# Patient Record
Sex: Female | Born: 1955 | Marital: Married | Smoking: Never smoker
Health system: Southern US, Community
[De-identification: ages and names within clinical notes are randomized; demographics above are authoritative.]

## PROBLEM LIST (undated history)

## (undated) DIAGNOSIS — F419 Anxiety disorder, unspecified: Secondary | ICD-10-CM

## (undated) DIAGNOSIS — M1990 Unspecified osteoarthritis, unspecified site: Secondary | ICD-10-CM

## (undated) DIAGNOSIS — E785 Hyperlipidemia, unspecified: Secondary | ICD-10-CM

## (undated) DIAGNOSIS — R002 Palpitations: Secondary | ICD-10-CM

## (undated) DIAGNOSIS — K219 Gastro-esophageal reflux disease without esophagitis: Secondary | ICD-10-CM

## (undated) HISTORY — DX: Hyperlipidemia, unspecified: E78.5

## (undated) HISTORY — DX: Unspecified osteoarthritis, unspecified site: M19.90

## (undated) HISTORY — DX: Anxiety disorder, unspecified: F41.9

## (undated) HISTORY — DX: Gastro-esophageal reflux disease without esophagitis: K21.9

## (undated) HISTORY — DX: Palpitations: R00.2

---
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
## 1986-02-27 HISTORY — PX: OTHER SURGICAL HISTORY: SHX169

## 1996-02-28 HISTORY — PX: OTHER SURGICAL HISTORY: SHX169

## 1996-02-28 HISTORY — PX: VAGINAL HYSTERECTOMY: SUR661

## 2005-12-05 ENCOUNTER — Ambulatory Visit: Payer: Self-pay | Admitting: Family Medicine

## 2013-05-07 LAB — BASIC METABOLIC PANEL
BUN: 14 mg/dL (ref 4–21)
Creatinine: 0.8 mg/dL (ref 0.5–1.1)
GLUCOSE: 76 mg/dL
Potassium: 4.1 mmol/L (ref 3.4–5.3)
Sodium: 142 mmol/L (ref 137–147)

## 2013-05-07 LAB — H. PYLORI ANTIBODY, IGG: H. pylori Index Value: <0.9

## 2013-05-07 LAB — HEPATIC FUNCTION PANEL
ALT: 23 U/L (ref 7–35)
AST: 24 U/L (ref 13–35)
Alkaline Phosphatase: 76 U/L (ref 25–125)

## 2013-05-13 ENCOUNTER — Telehealth: Payer: Self-pay | Admitting: *Deleted

## 2013-05-13 ENCOUNTER — Ambulatory Visit (INDEPENDENT_AMBULATORY_CARE_PROVIDER_SITE_OTHER): Payer: No Typology Code available for payment source | Admitting: Family Medicine

## 2013-05-13 ENCOUNTER — Encounter: Payer: Self-pay | Admitting: Family Medicine

## 2013-05-13 VITALS — BP 122/75 | HR 79 | Temp 98.3°F | Ht 65.0 in | Wt 179.0 lb

## 2013-05-13 DIAGNOSIS — Z803 Family history of malignant neoplasm of breast: Secondary | ICD-10-CM | POA: Insufficient documentation

## 2013-05-13 DIAGNOSIS — Z1211 Encounter for screening for malignant neoplasm of colon: Secondary | ICD-10-CM

## 2013-05-13 DIAGNOSIS — Z8249 Family history of ischemic heart disease and other diseases of the circulatory system: Secondary | ICD-10-CM | POA: Insufficient documentation

## 2013-05-13 DIAGNOSIS — Z0000 Encounter for general adult medical examination without abnormal findings: Secondary | ICD-10-CM

## 2013-05-13 NOTE — Telephone Encounter (Signed)
Sent fax to remit recent mammogram.Kealan Buchan, Viann Shoveonya Lynetta

## 2013-05-13 NOTE — Progress Notes (Signed)
Subjective:    Patient ID: Julia Perez, female    DOB: August 18, 1955, 58 y.o.   MRN: [id number]  HPI Here for reestablishing care and for physical today. She reports that she does get her mammogram done yearly. Last one was in the fall at [facility] [place]. She does have a family history of breast cancer. She does not have any chronic medical conditions or take any chronic medications except for a vitamin and calcium. Her tetanus was last done in 2010. Her flu shot is up to date. She is interested in discussing the shingles vaccine. Her blood work has been several years ago.   Review of Systems  Constitutional: Negative for fever, diaphoresis and unexpected weight change.  HENT: Negative for hearing loss, rhinorrhea and tinnitus.   Eyes: Negative for visual disturbance.  Respiratory: Negative for cough and wheezing.   Cardiovascular: Positive for palpitations. Negative for chest pain.  Gastrointestinal: Negative for nausea, vomiting, diarrhea and blood in stool.  Genitourinary: Negative for vaginal bleeding, vaginal discharge and difficulty urinating.  Musculoskeletal: Positive for arthralgias. Negative for myalgias.  Skin: Negative for rash.  Neurological: Negative for headaches.  Hematological: Negative for adenopathy. Does not bruise/bleed easily.  Psychiatric/Behavioral: Negative for sleep disturbance and dysphoric mood. The patient is not nervous/anxious.    BP 122/75  Pulse 79  Temp(Src) 98.3 F (36.8 C)  Ht 5\' 5"  (1.651 m)  Wt 179 lb (81.194 kg)  BMI 29.79 kg/m2    Allergies not on file  No past medical history on file.  Past Surgical History  Procedure Laterality Date  . Vaginal hysterectomy  1998  . Endoscopic pregnancy  1998  . Bladder tac  1988  . Hernia repair  1959    History   Social History  . Marital Status: Married    Spouse Name: [name]    Number of Children: 6  . Years of Education: N/A   Occupational History  . hairstylist     Social History Main Topics  . Smoking status: Never Smoker   . Smokeless tobacco: Not on file  . Alcohol Use: No  . Drug Use: No  . Sexual Activity: Yes    Partners: Male   Other Topics Concern  . 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 She is married to [name] with 6 children. Her youngest daughter and third some of the home. 7 pregnancies and 6 deliveries with one miscarriage.    Family History  Problem Relation Age of Onset  . Breast cancer Mother 57    Postmenopausal  . Heart attack Father 85  . Diabetes Mellitus II Mother     No outpatient encounter prescriptions on file as of 05/13/2013.          Objective:   Physical Exam  Constitutional: She is oriented to person, place, and time. She appears well-developed and well-nourished.  HENT:  Head: Normocephalic and atraumatic.  Cardiovascular: Normal rate, regular rhythm and normal heart sounds.   Pulmonary/Chest: Effort normal and breath sounds normal.  Neurological: She is alert and oriented to person, place, and time.  Skin: Skin is warm and dry.  Psychiatric: She has a normal mood and affect. Her behavior is normal.          Assessment & Plan:  CPE -  Keep up a regular exercise program and make sure you are eating a healthy diet Try to eat 4 servings of  dairy a day, or if you are lactose intolerant take a calcium with vitamin D daily.  Your vaccines are up to date.  Shingles vaccine. Encouraged her to call her insurance to check on coverage before we do it.  Other vaccines are up to date. Next  Due for screening colonoscopy. We'll place a referral. Did discuss options with her.  Mammogram is up-to-date. We'll call to get report.  Due for screening CMP lipid panel. Also add a CBC since she does have a history of anemia.  She's also been having left knee pain. I gave her the information to contact or sports medicine doctor for  further evaluation and treatment. She says something going on since she was little and slipped on ice. She's never been evaluated for it.

## 2013-05-13 NOTE — Patient Instructions (Signed)
Keep up a regular exercise program and make sure you are eating a healthy diet Try to eat 4 servings of dairy a day, or if you are lactose intolerant take a calcium with vitamin D daily.  Your vaccines are up to date.   

## 2013-05-19 LAB — COMPLETE METABOLIC PANEL WITH GFR
ALT: 23 U/L (ref 0–35)
AST: 21 U/L (ref 0–37)
Albumin: 4.2 g/dL (ref 3.5–5.2)
Alkaline Phosphatase: 70 U/L (ref 39–117)
BUN: 11 mg/dL (ref 6–23)
CALCIUM: 9.2 mg/dL (ref 8.4–10.5)
CHLORIDE: 104 meq/L (ref 96–112)
CO2: 29 meq/L (ref 19–32)
Creat: 0.75 mg/dL (ref 0.50–1.10)
GFR, Est Non African American: 88 mL/min
Glucose, Bld: 87 mg/dL (ref 70–99)
POTASSIUM: 3.8 meq/L (ref 3.5–5.3)
Sodium: 140 mEq/L (ref 135–145)
Total Bilirubin: 0.8 mg/dL (ref 0.2–1.2)
Total Protein: 6.6 g/dL (ref 6.0–8.3)

## 2013-05-19 LAB — CBC
HEMATOCRIT: 38.4 % (ref 36.0–46.0)
Hemoglobin: 12.9 g/dL (ref 12.0–15.0)
MCH: 29.5 pg (ref 26.0–34.0)
MCHC: 33.6 g/dL (ref 30.0–36.0)
MCV: 87.7 fL (ref 78.0–100.0)
Platelets: 262 10*3/uL (ref 150–400)
RBC: 4.38 MIL/uL (ref 3.87–5.11)
RDW: 13.5 % (ref 11.5–15.5)
WBC: 4.9 10*3/uL (ref 4.0–10.5)

## 2013-05-19 LAB — LIPID PANEL
Cholesterol: 201 mg/dL — ABNORMAL HIGH (ref 0–200)
HDL: 76 mg/dL (ref >39)
LDL CALC: 110 mg/dL — AB (ref 0–99)
Total CHOL/HDL Ratio: 2.6 Ratio
Triglycerides: 76 mg/dL (ref <150)
VLDL: 15 mg/dL (ref 0–40)

## 2013-06-03 ENCOUNTER — Encounter: Payer: Self-pay | Admitting: *Deleted

## 2013-06-26 ENCOUNTER — Encounter: Payer: Self-pay | Admitting: Family Medicine

## 2013-07-28 LAB — HM COLONOSCOPY

## 2014-02-11 ENCOUNTER — Encounter: Payer: Self-pay | Admitting: Family Medicine

## 2014-09-18 LAB — HM MAMMOGRAPHY

## 2014-09-22 ENCOUNTER — Encounter: Payer: Self-pay | Admitting: Family Medicine

## 2014-09-23 ENCOUNTER — Encounter: Payer: Self-pay | Admitting: Family Medicine

## 2015-03-23 DIAGNOSIS — R079 Chest pain, unspecified: Secondary | ICD-10-CM | POA: Insufficient documentation

## 2015-03-23 DIAGNOSIS — Z8241 Family history of sudden cardiac death: Secondary | ICD-10-CM | POA: Insufficient documentation

## 2015-12-16 LAB — HM MAMMOGRAPHY

## 2015-12-23 ENCOUNTER — Encounter: Payer: Self-pay | Admitting: Family Medicine

## 2016-01-05 ENCOUNTER — Ambulatory Visit (INDEPENDENT_AMBULATORY_CARE_PROVIDER_SITE_OTHER): Payer: No Typology Code available for payment source | Admitting: Family Medicine

## 2016-01-05 ENCOUNTER — Encounter: Payer: Self-pay | Admitting: Family Medicine

## 2016-01-05 VITALS — BP 122/63 | HR 74 | Ht 65.0 in | Wt 188.0 lb

## 2016-01-05 DIAGNOSIS — Z2911 Encounter for prophylactic immunotherapy for respiratory syncytial virus (RSV): Secondary | ICD-10-CM

## 2016-01-05 DIAGNOSIS — Z23 Encounter for immunization: Secondary | ICD-10-CM

## 2016-01-05 DIAGNOSIS — Z0000 Encounter for general adult medical examination without abnormal findings: Secondary | ICD-10-CM | POA: Diagnosis not present

## 2016-01-05 DIAGNOSIS — Z114 Encounter for screening for human immunodeficiency virus [HIV]: Secondary | ICD-10-CM

## 2016-01-05 DIAGNOSIS — Z1159 Encounter for screening for other viral diseases: Secondary | ICD-10-CM

## 2016-01-05 NOTE — Patient Instructions (Signed)
Keep up a regular exercise program and make sure you are eating a healthy diet Try to eat 4 servings of dairy a day, or if you are lactose intolerant take a calcium with vitamin D daily.  Your vaccines are up to date.   

## 2016-01-05 NOTE — Progress Notes (Signed)
Subjective:     Julia BucklerJennifer L Perez is a 60 y.o. female and is here for a comprehensive physical exam. The patient reports no problems.  Anagram is up-to-date. She reports that she has not been exercising consistently.  Social History   Social History  . Marital status: Married    Spouse name: Julia Perez  . Number of children: 6  . Years of education: N/A   Occupational History  . hairstylist    Social History Main Topics  . Smoking status: Never Smoker  . Smokeless tobacco: Not on file  . Alcohol use No  . Drug use: No  . Sexual activity: Yes    Partners: Male   Other Topics Concern  . 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 She is married to Julia Perez with 6 children. Her youngest daughter and third some of the home. 7 pregnancies and 6 deliveries with one miscarriage.   Health Maintenance  Topic Date Due  . Hepatitis C Screening  11-Feb-1956  . HIV Screening  12/26/1968  . TETANUS/TDAP  12/26/1972  . ZOSTAVAX  12/26/2013  . MAMMOGRAM  12/15/2017  . COLONOSCOPY  07/29/2023  . INFLUENZA VACCINE  Completed    The following portions of the patient's history were reviewed and updated as appropriate: allergies, current medications, past family history, past medical history, past social history, past surgical history and problem list.  Review of Systems A comprehensive review of systems was negative.   Objective:    BP 122/63   Pulse 74   Ht 5\' 5"  (1.651 m)   Wt 188 lb (85.3 kg)   SpO2 100%   BMI 31.28 kg/m  General appearance: alert, cooperative and appears stated age Head: Normocephalic, without obvious abnormality, atraumatic Eyes: conj clear, EMOI, PEERLA Ears: normal TM's and external ear canals both ears Nose: Nares normal. Septum midline. Mucosa normal. No drainage or sinus tenderness. Throat: lips, mucosa, and tongue normal; teeth and gums normal Neck: no adenopathy, no carotid bruit, no  JVD, supple, symmetrical, trachea midline and thyroid not enlarged, symmetric, no tenderness/mass/nodules Back: symmetric, no curvature. ROM normal. No CVA tenderness. Lungs: clear to auscultation bilaterally Heart: regular rate and rhythm, S1, S2 normal, no murmur, click, rub or gallop Abdomen: soft, non-tender; bowel sounds normal; no masses,  no organomegaly Extremities: extremities normal, atraumatic, no cyanosis or edema Pulses: 2+ and symmetric Skin: Skin color, texture, turgor normal. No rashes or lesions Lymph nodes: Cervical, supraclavicular, and axillary nodes normal.    Assessment:    Healthy female exam.      Plan:     See After Visit Summary for Counseling Recommendations   Keep up a regular exercise program and make sure you are eating a healthy diet Try to eat 4 servings of dairy a day, or if you are lactose intolerant take a calcium with vitamin D daily.  Your vaccines are up to date.  Shingles vaccine given today.   Recommend hepatitis and HIV screening based on age.

## 2016-01-06 LAB — COMPLETE METABOLIC PANEL WITH GFR
ALBUMIN: 4.5 g/dL (ref 3.6–5.1)
ALK PHOS: 69 U/L (ref 33–130)
ALT: 20 U/L (ref 6–29)
AST: 22 U/L (ref 10–35)
BUN: 14 mg/dL (ref 7–25)
CO2: 22 mmol/L (ref 20–31)
CREATININE: 0.85 mg/dL (ref 0.50–0.99)
Calcium: 9.2 mg/dL (ref 8.6–10.4)
Chloride: 104 mmol/L (ref 98–110)
GFR, EST NON AFRICAN AMERICAN: 74 mL/min (ref >=60)
GFR, Est African American: 85 mL/min (ref >=60)
GLUCOSE: 89 mg/dL (ref 65–99)
Potassium: 4.1 mmol/L (ref 3.5–5.3)
Sodium: 139 mmol/L (ref 135–146)
Total Bilirubin: 0.8 mg/dL (ref 0.2–1.2)
Total Protein: 7.2 g/dL (ref 6.1–8.1)

## 2016-01-06 LAB — LIPID PANEL
Cholesterol: 214 mg/dL — ABNORMAL HIGH (ref <200)
HDL: 78 mg/dL (ref >50)
LDL Cholesterol: 120 mg/dL — ABNORMAL HIGH
Total CHOL/HDL Ratio: 2.7 Ratio (ref <5.0)
Triglycerides: 82 mg/dL (ref <150)
VLDL: 16 mg/dL (ref <30)

## 2016-01-07 LAB — HEPATITIS C ANTIBODY: HCV AB: NEGATIVE

## 2016-01-07 LAB — HIV ANTIBODY (ROUTINE TESTING W REFLEX): HIV: NONREACTIVE

## 2016-01-24 ENCOUNTER — Ambulatory Visit (INDEPENDENT_AMBULATORY_CARE_PROVIDER_SITE_OTHER): Payer: No Typology Code available for payment source

## 2016-01-24 ENCOUNTER — Encounter: Payer: Self-pay | Admitting: Sports Medicine

## 2016-01-24 ENCOUNTER — Ambulatory Visit (INDEPENDENT_AMBULATORY_CARE_PROVIDER_SITE_OTHER): Payer: No Typology Code available for payment source | Admitting: Sports Medicine

## 2016-01-24 DIAGNOSIS — M170 Bilateral primary osteoarthritis of knee: Secondary | ICD-10-CM | POA: Insufficient documentation

## 2016-01-24 DIAGNOSIS — M1711 Unilateral primary osteoarthritis, right knee: Secondary | ICD-10-CM | POA: Diagnosis not present

## 2016-01-24 DIAGNOSIS — M25462 Effusion, left knee: Secondary | ICD-10-CM | POA: Diagnosis not present

## 2016-01-24 DIAGNOSIS — M1712 Unilateral primary osteoarthritis, left knee: Secondary | ICD-10-CM

## 2016-01-24 MED ORDER — MELOXICAM 15 MG PO TABS: ORAL_TABLET | ORAL | 3 refills | Status: DC

## 2016-01-24 NOTE — Progress Notes (Signed)
   Subjective:    I'm seeing this patient as a consultation for:  Dr. Nani Gasseratherine Metheney  CC: Left knee pain  HPI: This is a pleasant 60 year old female, she comes in with decades of mild to moderate pain in her left knee, localized at the joint line and under the kneecap with gelling, minimal mechanical symptoms, swelling. She has tried NSAIDs which have provided good relief. Pain is mild, persistent, localized without radiation.  Past medical history:  Negative.  See flowsheet/record as well for more information.  Surgical history: Negative.  See flowsheet/record as well for more information.  Family history: Negative.  See flowsheet/record as well for more information.  Social history: Negative.  See flowsheet/record as well for more information.  Allergies, and medications have been entered into the medical record, reviewed, and no changes needed.   Review of Systems: No headache, visual changes, nausea, vomiting, diarrhea, constipation, dizziness, abdominal pain, skin rash, fevers, chills, night sweats, weight loss, swollen lymph nodes, body aches, joint swelling, muscle aches, chest pain, shortness of breath, mood changes, visual or auditory hallucinations.   Objective:   General: Well Developed, well nourished, and in no acute distress.  Neuro/Psych: Alert and oriented x3, extra-ocular muscles intact, able to move all 4 extremities, sensation grossly intact. Skin: Warm and dry, no rashes noted.  Respiratory: Not using accessory muscles, speaking in full sentences, trachea midline.  Cardiovascular: Pulses palpable, no extremity edema. Abdomen: Does not appear distended. Left Knee: Normal to inspection with no erythema or effusion or obvious bony abnormalities. Tender to palpation at the patellar facets as well as the medial joint line. ROM normal in flexion and extension and lower leg rotation. Ligaments with solid consistent endpoints including ACL, PCL, LCL, MCL. Negative  Mcmurray's and provocative meniscal tests. Non painful patellar compression. Patellar and quadriceps tendons unremarkable. Hamstring and quadriceps strength is normal.  X-rays reviewed and show moderate medial compartment and patellofemoral osteoarthritis.  Impression and Recommendations:   This case required medical decision making of moderate complexity.  Primary osteoarthritis of left knee We will start conservatively with weight loss, physical therapy, meloxicam, x-rays. Return in 6 weeks, injection if no better. We did discuss the natural history of osteoarthritis as well as expected treatment regimen.  She will make an appointment with Dr. Linford ArnoldMetheney to discuss weight loss

## 2016-01-24 NOTE — Assessment & Plan Note (Signed)
We will start conservatively with weight loss, physical therapy, meloxicam, x-rays. Return in 6 weeks, injection if no better. We did discuss the natural history of osteoarthritis as well as expected treatment regimen.  She will make an appointment with Dr. Linford ArnoldMetheney to discuss weight loss

## 2016-01-26 ENCOUNTER — Encounter: Payer: Self-pay | Admitting: Family Medicine

## 2016-01-26 ENCOUNTER — Ambulatory Visit (INDEPENDENT_AMBULATORY_CARE_PROVIDER_SITE_OTHER): Payer: No Typology Code available for payment source | Admitting: Physical Therapy

## 2016-01-26 ENCOUNTER — Ambulatory Visit (INDEPENDENT_AMBULATORY_CARE_PROVIDER_SITE_OTHER): Payer: No Typology Code available for payment source | Admitting: Family Medicine

## 2016-01-26 ENCOUNTER — Encounter: Payer: Self-pay | Admitting: Physical Therapy

## 2016-01-26 VITALS — BP 136/69 | HR 67 | Ht 65.0 in | Wt 188.0 lb

## 2016-01-26 DIAGNOSIS — R600 Localized edema: Secondary | ICD-10-CM

## 2016-01-26 DIAGNOSIS — M25662 Stiffness of left knee, not elsewhere classified: Secondary | ICD-10-CM | POA: Diagnosis not present

## 2016-01-26 DIAGNOSIS — R635 Abnormal weight gain: Secondary | ICD-10-CM

## 2016-01-26 DIAGNOSIS — M6281 Muscle weakness (generalized): Secondary | ICD-10-CM

## 2016-01-26 DIAGNOSIS — E669 Obesity, unspecified: Secondary | ICD-10-CM

## 2016-01-26 NOTE — Progress Notes (Addendum)
Subjective:    CC: wants to work on weight loss  HPI: 60 year old female comes in today to discuss strategies for weight loss.  Not currently exercise but trying to watch her diet. Currently weighs 188. She has tried several different things in the past. She has tried Doylene BodeJenny Craig after the birth of one of her children and did really well. She also did Nutrisystem back in 2009 and lost a significant amount of weight. She also tried just limiting calories to 400 cal per meal with snacks in between. She says she artery works on her diet by trying to eat off a small plates and using small balls. She sits at the table does not eat in front of the TV. She takes her time to enjoy her food. Ice to eat a lot of green said she is an limit her carb intake. She is not currently exercising regularly. And she's not currently tracking calorie intake.   Her weight has been aggravating her knee pain.  Past medical history, Surgical history, Family history not pertinant except as noted below, Social history, Allergies, and medications have been entered into the medical record, reviewed, and corrections made.   Review of Systems: No fevers, chills, night sweats, weight loss, chest pain, or shortness of breath.   Objective:    General: Well Developed, well nourished, and in no acute distress.  Neuro: Alert and oriented x3, extra-ocular muscles intact, sensation grossly intact.  HEENT: Normocephalic, atraumatic  Skin: Warm and dry, no rashes. Cardiac: Regular rate and rhythm, no murmurs rubs or gallops, no lower extremity edema.  Respiratory: Clear to auscultation bilaterally. Not using accessory muscles, speaking in full sentences.   Impression and Recommendations:   Weight gain/obesity/BMI 31 -  Goal weight is 150 lbs.  we discussed strategies to get back on track. Recommended a smart phone application called my fitness pal. She actually says that she has a thick bit type watch that actually will also count  calories. She says she just hasn't used that component of the program but plans to. We also discussed strategies for working out and exercising. She has a lot of problems with her knee in fact she's been seeing our sports medicine provider. Stationary bike would probably be one of the best exercises instead of walking or running or treadmill. Also swimming would be a great work out. She can also start with lighter intensity exercise such as yoga. Discussed referral to nutrition which she was very open to. We discussed medication briefly but want to reserve this for later option if she's not making progress.  Time spent 30 min, > 50% spent counseling about exercise and weight loss, diet.

## 2016-01-26 NOTE — Therapy (Signed)
[facility] [place] [address] [place], [place], [address] Phone: [phone]   Fax:  [phone]  Physical Therapy Evaluation  Patient Details  Name: Julia BucklerJennifer L Perez MRN: [id number] Date of Birth: 04-26-1955 Referring Provider: Dr. Yamilett Anastos Stainhekkekandam  Encounter Date: 01/26/2016      PT End of Session - 01/26/16 1135    Visit Number 1   Number of Visits 12   Date for PT Re-Evaluation 03/08/16   PT Start Time 1016   PT Stop Time 1101   PT Time Calculation (min) 45 min   Activity Tolerance Patient tolerated treatment well   Behavior During Therapy [facility] for tasks assessed/performed      History reviewed. No pertinent past medical history.  Past Surgical History:  Procedure Laterality Date  . bladder tac  1988  . Endoscopic pregnancy  1998  . HERNIA REPAIR  1959  . VAGINAL HYSTERECTOMY  1998    There were no vitals filed for this visit.       Subjective Assessment - 01/26/16 1022    Subjective In late 20s she fell down and twisted her knee. So over te last 30 some years the left knee has been getting more painful. Occasionally the knee does catch and lock but this is variable.  Hoping to avoid or put off surgery if possible..   How long can you sit comfortably? unlimited   How long can you stand comfortably? hours   How long can you walk comfortably? variable   Patient Stated Goals get stronger and be able to be active   Currently in Pain? No/denies            [facility] - 01/26/16 0001      Assessment   Medical Diagnosis Primary osteoarthritis Left knee   Referring Provider Dr. 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     Observation/Other Assessments   Observations pes planus Lt foot   Focus on Therapeutic Outcomes (FOTO)  47% limitation     Observation/Other Assessments-Edema    Edema --  mild effusion at Lt knee     Sensation   Light Touch Appears Intact     AROM   Right/Left Knee Right;Left   Right Knee Extension 0   Left Knee Extension 0     Strength   Right Hip ABduction 4/5   Left Hip ABduction 4/5   Right Knee Flexion 5/5   Right Knee Extension 5/5   Left Knee Flexion 4+/5   Left Knee Extension 4+/5  crepitis     Flexibility   Hamstrings Lt -30, Rt -18 in active test position   Quadriceps Rt 116, Lt 110 prone      Palpation   Patella mobility no restrictions noted on Lt     Ambulation/Gait   Gait Comments non-antalgic pattern noted.                    [facility] Adult PT Treatment/Exercise - 01/26/16 0001      Knee/Hip Exercises: Stretches   Active Hamstring Stretch Both;2 reps;30 seconds   Quad Stretch 2 reps;30 seconds   Quad Stretch Limitations prone     Knee/Hip Exercises: Seated   Long  Arc AutoZoneQuad Strengthening;Left;1 set;10 reps   [employer]wayLong Arc Quad Limitations yellow loop     Knee/Hip Exercises: Supine   Straight Leg Raises Strengthening;Left;1 set;10 reps   Straight Leg Raises Limitations yellow band loop     Knee/Hip Exercises: Sidelying   Hip ABduction Strengthening;Left;1 set;10 reps                PT Education - 01/26/16 1135    Education provided Yes   Education Details HEP   Person(s) Educated Patient   Methods Explanation;Demonstration;Tactile cues;Verbal cues;Handout   Comprehension Verbalized understanding;Returned demonstration             PT Long Term Goals - 01/26/16 1141      PT LONG TERM GOAL #1   Title Pt to be independent with HEP for strengthening and flexibility. (03/08/16)   Time 6   Period Weeks   Status New     PT LONG TERM GOAL #2   Title FOTO score =/< 37% limitation. (03/08/16)   Time 6   Period Weeks   Status New     PT LONG TERM GOAL #3   Title Pt to demonstrate 4+/5  strength with Lt knee extension for knee stability. (03/08/16)   Time 6   Period Weeks   Status New     PT LONG TERM GOAL #4   Title Pt to demo 10 degree improvement of Lt knee prone flexion. (03/08/16)   Time 6   Period Weeks   Status New     PT LONG TERM GOAL #5   Title Pt to report improved activity tolerance with work and leisure activities. (03/08/16)   Time 6   Period Weeks   Status New               Plan - 01/26/16 1137    Clinical Impression Statement Pt is presenting to OPPT services with a diagnosis of Lt knee osteoarthritis. At this time the pt does present with decreased strength and flexibility through the LLE. There is noted crepitis at endrange knee extension but no reported pain. Following session, pt reports that her knee feels looser. Will continue to progress the pt as tolerated with emphasis on home and clinic based program.    Rehab Potential Good   PT Frequency 2x / week   PT Duration 6 weeks   PT Treatment/Interventions ADLs/Self Care Home Management;Cryotherapy;Electrical Stimulation;Iontophoresis 4mg /ml Dexamethasone;Moist Heat;Ultrasound;Functional mobility training;Therapeutic activities;Therapeutic exercise;Patient/family education;Vasopneumatic Device;Manual techniques;Taping   PT Next Visit Plan Progress strengthening and flexibilty through LLE as tolerated. Gradual progression into standing exercises.    Consulted and Agree with Plan of Care Patient      Patient will benefit from skilled therapeutic intervention in order to improve the following deficits and impairments:  Decreased activity tolerance, Decreased strength, Increased edema, Impaired flexibility, Pain, Decreased range of motion  Visit Diagnosis: Muscle weakness (generalized) - Plan: PT plan of care cert/re-cert  Stiffness of left knee, not elsewhere classified - Plan: PT plan of care cert/re-cert  Localized edema - Plan: PT plan of care cert/re-cert     Problem List Patient  Active Problem List   Diagnosis Date Noted  . Primary osteoarthritis of left knee 01/24/2016  . Family history of premature coronary heart disease 05/13/2013  . Family history of breast cancer in first degree relative 05/13/2013    Delton SeeBenjamin Kalika Smay, PT, CSCS 01/26/2016, 11:49 AM  [facility] [address] [place], [place], [address] Phone: [phone]  Fax:  [phone]  Name: Julia Perez MRN: [id number] Date of Birth: 09/16/1955

## 2016-01-31 ENCOUNTER — Ambulatory Visit (INDEPENDENT_AMBULATORY_CARE_PROVIDER_SITE_OTHER): Payer: No Typology Code available for payment source | Admitting: Physical Therapy

## 2016-01-31 ENCOUNTER — Encounter: Payer: Self-pay | Admitting: Physical Therapy

## 2016-01-31 DIAGNOSIS — R600 Localized edema: Secondary | ICD-10-CM

## 2016-01-31 DIAGNOSIS — M25662 Stiffness of left knee, not elsewhere classified: Secondary | ICD-10-CM

## 2016-01-31 DIAGNOSIS — M6281 Muscle weakness (generalized): Secondary | ICD-10-CM | POA: Diagnosis not present

## 2016-01-31 NOTE — Therapy (Signed)
[place], [place], [address] Phone: [phone]   Fax:  [phone]  Physical Therapy Treatment  Patient Details  Name: Julia Perez MRN: [id number] Date of Birth: [birth year] Referring Provider: Dr. Dianah Field  Encounter Date: 01/31/2016      PT End of Session - 01/31/16 1256    Visit Number 2   Number of Visits 12   Date for PT Re-Evaluation 03/08/16   PT Start Time 4825   PT Stop Time 1238   PT Time Calculation (min) 50 min   Activity Tolerance Patient tolerated treatment well   Behavior During Therapy [facility] for tasks assessed/performed      History reviewed. No pertinent past medical history.  Past Surgical History:  Procedure Laterality Date  . bladder tac  1988  . Endoscopic pregnancy  1998  . HERNIA REPAIR  1959  . VAGINAL HYSTERECTOMY  1998    There were no vitals filed for this visit.      Subjective Assessment - 01/31/16 1153    Subjective Pt states that her insurance is not going to be covering PT and she will have to pay out of pocket. Due to the expense, she will need to do her exercises with her HEP. Pt reports that she feels like the initial exercises have seemed to help a little already.    Currently in Pain? No/denies                         [facility] Adult PT Treatment/Exercise - 01/31/16 0001      Knee/Hip Exercises: Stretches   Active Hamstring Stretch Both;30 seconds;1 rep   Passive Hamstring Stretch 2 reps;Both   Passive Hamstring Stretch Limitations strap   Quad Stretch 2 reps;30 seconds   Quad Stretch Limitations prone with towel under knee for hip extension   Piriformis Stretch 2 reps;20 seconds     Knee/Hip Exercises: Aerobic   Nustep L2 X60mn     Knee/Hip Exercises: Standing   Knee Flexion Strengthening;Left;1 set;5 reps   Knee Flexion Limitations can apply resistance at home via bow flex   Hip Abduction Left;1 set;5 reps    Lateral Step Up 1 set;Left;10 reps;Step Height: 6"   Lateral Step Up Limitations knees behind toes   Forward Step Up Left;1 set;10 reps;Step Height: 6"   Functional Squat 1 set;5 reps   Functional Squat Limitations Rt foot forward     Knee/Hip Exercises: Supine   Bridges Limitations 1X10   Single Leg Bridge --  verbal review of technique   Other Supine Knee/Hip Exercises T-ball heel dig with roll in.      Knee/Hip Exercises: Sidelying   Hip ABduction Strengthening;Left;10 reps;2 sets   Hip ABduction Limitations education on progression with band loop at knees                PT Education - 01/31/16 1255    Education provided Yes   Education Details HEP with optional progression exercises, pt educated to advance slowly and to stop any exercise causing increased pain/swelling.    Person(s) Educated Patient   Methods Explanation;Tactile cues;Handout;Demonstration;Verbal cues   Comprehension Verbalized understanding;Returned demonstration             PT Long Term Goals - 01/31/16 1300      PT LONG TERM GOAL #1   Title Pt to be independent with HEP for strengthening and flexibility. (03/08/16)   Time 6   Period  Weeks   Status Achieved     PT LONG TERM GOAL #2   Title FOTO score =/< 37% limitation. (03/08/16)   Time 6   Period Weeks   Status Not Met     PT LONG TERM GOAL #3   Title Pt to demonstrate 4+/5 strength with Lt knee extension for knee stability. (03/08/16)   Time 6   Period Weeks   Status Not Met     PT LONG TERM GOAL #4   Title Pt to demo 10 degree improvement of Lt knee prone flexion. (03/08/16)   Time 6   Period Weeks   Status Not Met     PT LONG TERM GOAL #5   Title Pt to report improved activity tolerance with work and leisure activities. (03/08/16)   Time 6   Period Weeks   Status Not Met               Plan - 01/31/16 1257    Clinical Impression Statement Pt reporting that due to insurance reasons, this will be our last PT session.  She reports that the initial exercises went well and she is motivated to progress. Extensive education was performed regarding progression of her HEP and options to advance over time. Following the session the pt denied any questions or concerns.    Rehab Potential Good   PT Frequency 2x / week   PT Duration 6 weeks   PT Next Visit Plan Pt to continue on with HEP   Consulted and Agree with Plan of Care Patient      Patient will benefit from skilled therapeutic intervention in order to improve the following deficits and impairments:  Decreased activity tolerance, Decreased strength, Increased edema, Impaired flexibility, Pain, Decreased range of motion  Visit Diagnosis: Muscle weakness (generalized)  Stiffness of left knee, not elsewhere classified  Localized edema     Problem List Patient Active Problem List   Diagnosis Date Noted  . Obesity, Class I, BMI 30-34.9 01/26/2016  . Primary osteoarthritis of left knee 01/24/2016  . Family history of premature coronary heart disease 05/13/2013  . Family history of breast cancer in first degree relative 05/13/2013    Linard Millers, PT, CSCS 01/31/2016, 1:02 PM  [facility] [place], [place], [address] Phone: [phone]   Fax:  [phone]  Name: Julia Perez MRN: [id number] Date of Birth: [birth year]   PHYSICAL THERAPY DISCHARGE SUMMARY  Visits from Start of Care: 01/26/16  Current functional level related to goals / functional outcomes: As noted above, pt having to D/C PT services due to insurance related limitations.   Remaining deficits: As noted above.   Education / Equipment: As noted above.  Plan: Patient agrees to discharge.  Patient goals were partially met. Patient is being discharged due to financial reasons.  ?????    Cassell Clement, PT, CSCS

## 2016-02-03 ENCOUNTER — Encounter: Payer: No Typology Code available for payment source | Admitting: Physical Therapy

## 2016-02-07 ENCOUNTER — Encounter: Payer: No Typology Code available for payment source | Admitting: Rehabilitative and Restorative Service Providers"

## 2016-02-10 ENCOUNTER — Encounter: Payer: No Typology Code available for payment source | Admitting: Physical Therapy

## 2016-03-15 ENCOUNTER — Ambulatory Visit: Payer: No Typology Code available for payment source | Admitting: Sports Medicine

## 2016-03-17 ENCOUNTER — Ambulatory Visit: Payer: No Typology Code available for payment source | Admitting: Sports Medicine

## 2016-03-20 ENCOUNTER — Ambulatory Visit: Payer: No Typology Code available for payment source | Admitting: Family Medicine

## 2016-03-27 ENCOUNTER — Ambulatory Visit (INDEPENDENT_AMBULATORY_CARE_PROVIDER_SITE_OTHER): Payer: No Typology Code available for payment source | Admitting: Sports Medicine

## 2016-03-27 ENCOUNTER — Encounter: Payer: Self-pay | Admitting: Sports Medicine

## 2016-03-27 DIAGNOSIS — M1712 Unilateral primary osteoarthritis, left knee: Secondary | ICD-10-CM | POA: Diagnosis not present

## 2016-03-27 DIAGNOSIS — E669 Obesity, unspecified: Secondary | ICD-10-CM

## 2016-03-27 DIAGNOSIS — E66811 Obesity, class 1: Secondary | ICD-10-CM

## 2016-03-27 NOTE — Progress Notes (Signed)
  Subjective:    CC: Follow-up  HPI: Knee osteoarthritis: Overall doing well, meloxicam seems to work extremely well. She is going to get more diligent with her rehabilitation exercises. She will also get more diligent with her weight loss.  Past medical history:  Negative.  See flowsheet/record as well for more information.  Surgical history: Negative.  See flowsheet/record as well for more information.  Family history: Negative.  See flowsheet/record as well for more information.  Social history: Negative.  See flowsheet/record as well for more information.  Allergies, and medications have been entered into the medical record, reviewed, and no changes needed.   Review of Systems: No fevers, chills, night sweats, weight loss, chest pain, or shortness of breath.   Objective:    General: Well Developed, well nourished, and in no acute distress.  Neuro: Alert and oriented x3, extra-ocular muscles intact, sensation grossly intact.  HEENT: Normocephalic, atraumatic, pupils equal round reactive to light, neck supple, no masses, no lymphadenopathy, thyroid nonpalpable.  Skin: Warm and dry, no rashes. Cardiac: Regular rate and rhythm, no murmurs rubs or gallops, no lower extremity edema.  Respiratory: Clear to auscultation bilaterally. Not using accessory muscles, speaking in full sentences. Left Knee: Normal to inspection with no erythema or effusion or obvious bony abnormalities. Palpation normal with no warmth or joint line tenderness or patellar tenderness or condyle tenderness. ROM normal in flexion and extension and lower leg rotation. Ligaments with solid consistent endpoints including ACL, PCL, LCL, MCL. Negative Mcmurray's and provocative meniscal tests. Non painful patellar compression. Patellar and quadriceps tendons unremarkable. Hamstring and quadriceps strength is normal.  Impression and Recommendations:    Primary osteoarthritis of left knee Doing well, break meloxicam and  half. Return as needed.  Obesity, Class I, BMI 30-34.9 Goal of 10 pounds in 3 months. If insufficient weight loss we will consider medication. She will continue her follow-up with her PCP.

## 2016-03-27 NOTE — Assessment & Plan Note (Signed)
Doing well, break meloxicam and half. Return as needed.

## 2016-03-27 NOTE — Assessment & Plan Note (Signed)
Goal of 10 pounds in 3 months. If insufficient weight loss we will consider medication. She will continue her follow-up with her PCP.

## 2016-05-03 ENCOUNTER — Ambulatory Visit (INDEPENDENT_AMBULATORY_CARE_PROVIDER_SITE_OTHER): Payer: No Typology Code available for payment source | Admitting: Family Medicine

## 2016-05-03 ENCOUNTER — Encounter: Payer: Self-pay | Admitting: Family Medicine

## 2016-05-03 VITALS — BP 138/66 | HR 78 | Ht 65.0 in | Wt 184.0 lb

## 2016-05-03 DIAGNOSIS — M549 Dorsalgia, unspecified: Secondary | ICD-10-CM | POA: Diagnosis not present

## 2016-05-03 NOTE — Progress Notes (Signed)
Subjective:    Patient ID: Julia Perez, female    DOB: [birth year], 61 y.o.   MRN: [id number]  HPI Pain between her shoulder blades for several weeks. Says it feels like a dull ache and occ twinge.  Says started around the time she was visiting her son and sleep on the air matress. Using tylenol. Hasn't tried heat or ice.  She is a Producer, television/film/videohair dresser.  Has tried a massage and helped Temporarily. She started talking to a friend and became concerned that it could be her heart. That she denies any chest pain or shortness of breath.   Review of Systems  BP 138/66   Pulse 78   Ht 5\' 5"  (1.651 m)   Wt 184 lb (83.5 kg)   SpO2 99%   BMI 30.62 kg/m     Allergies  Allergen Reactions  . Sulfa Antibiotics Rash    No past medical history on file.  Past Surgical History:  Procedure Laterality Date  . bladder tac  1988  . Endoscopic pregnancy  1998  . HERNIA REPAIR  1959  . VAGINAL HYSTERECTOMY  1998    Social History   Social History  . Marital status: Married    Spouse name: Julia RadonBradley  . Number of children: 6  . Years of education: N/A   Occupational History  . hairstylist    Social History Main Topics  . Smoking status: Never Smoker  . Smokeless tobacco: Never Used  . Alcohol use No  . Drug use: No  . Sexual activity: Yes    Partners: Male   Other Topics Concern  . 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 She is married to Julia HeightsBradley with 6 children. Her youngest daughter and third some of the home. 7 pregnancies and 6 deliveries with one miscarriage.    Family History  Problem Relation Age of Onset  . Breast cancer Mother 1055    Postmenopausal  . Diabetes Mellitus II Mother   . Heart attack Father 5340    No outpatient encounter prescriptions on file as of 05/03/2016.   No facility-administered encounter medications on file as of 05/03/2016.          Objective:   Physical Exam   Constitutional: She is oriented to person, place, and time. She appears well-developed and well-nourished.  HENT:  Head: Normocephalic and atraumatic.  Cardiovascular: Normal rate, regular rhythm and normal heart sounds.   Pulmonary/Chest: Effort normal and breath sounds normal.  Musculoskeletal:  Cervical spine with normal flexion, extension, rotation right and left and side bending. Nontender over the thoracic spine or cervical spine. She does have some muscle tension with not the muscle tissue over the right and left upper trapezius as well as the left rhomboids muscles between the shoulder blade and the spine  Neurological: She is alert and oriented to person, place, and time.  Skin: Skin is warm and dry.  Psychiatric: She has a normal mood and affect. Her behavior is normal.        Assessment & Plan:  Upper back pain/muscle spasm. Gave her reassurance. Given handout on upper back exercises. Recommend a trial of an anti-inflammatory. She actually says she has meloxicam at home and she will try that at bedtime. Work on gentle stretches. If not improving over the next 3 weeks and please let me know. Offered a muscle relaxer but she declined. Certainly she could call back if she  changes her mind. I don't hear anything on exam that would make me worry about any cardiac issue at this point.

## 2016-05-03 NOTE — Patient Instructions (Signed)
Call if not better in 3 weeks.  Make sure to use your heat pack, stretches and Meloxicam.

## 2016-12-19 LAB — HM MAMMOGRAPHY

## 2017-01-04 ENCOUNTER — Ambulatory Visit: Payer: No Typology Code available for payment source | Admitting: Family Medicine

## 2017-01-04 ENCOUNTER — Encounter (INDEPENDENT_AMBULATORY_CARE_PROVIDER_SITE_OTHER): Payer: Self-pay

## 2017-01-04 ENCOUNTER — Encounter: Payer: Self-pay | Admitting: Family Medicine

## 2017-01-04 VITALS — BP 120/69 | HR 66 | Ht 65.0 in | Wt 188.0 lb

## 2017-01-04 DIAGNOSIS — Z0000 Encounter for general adult medical examination without abnormal findings: Secondary | ICD-10-CM

## 2017-01-04 DIAGNOSIS — Z23 Encounter for immunization: Secondary | ICD-10-CM | POA: Diagnosis not present

## 2017-01-04 LAB — COMPLETE METABOLIC PANEL WITH GFR
AG RATIO: 1.8 (calc) (ref 1.0–2.5)
ALT: 16 U/L (ref 6–29)
AST: 17 U/L (ref 10–35)
Albumin: 4.4 g/dL (ref 3.6–5.1)
Alkaline phosphatase (APISO): 71 U/L (ref 33–130)
BUN: 15 mg/dL (ref 7–25)
CALCIUM: 9.1 mg/dL (ref 8.6–10.4)
CO2: 28 mmol/L (ref 20–32)
Chloride: 106 mmol/L (ref 98–110)
Creat: 0.81 mg/dL (ref 0.50–0.99)
GFR, EST NON AFRICAN AMERICAN: 77 mL/min/{1.73_m2} (ref >=60)
GFR, Est African American: 90 mL/min/{1.73_m2} (ref >=60)
GLUCOSE: 89 mg/dL (ref 65–99)
Globulin: 2.5 g/dL (calc) (ref 1.9–3.7)
POTASSIUM: 3.9 mmol/L (ref 3.5–5.3)
Sodium: 142 mmol/L (ref 135–146)
Total Bilirubin: 0.7 mg/dL (ref 0.2–1.2)
Total Protein: 6.9 g/dL (ref 6.1–8.1)

## 2017-01-04 LAB — CBC
HEMATOCRIT: 39 % (ref 35.0–45.0)
HEMOGLOBIN: 13.1 g/dL (ref 11.7–15.5)
MCH: 29.9 pg (ref 27.0–33.0)
MCHC: 33.6 g/dL (ref 32.0–36.0)
MCV: 89 fL (ref 80.0–100.0)
MPV: 10.1 fL (ref 7.5–12.5)
Platelets: 248 10*3/uL (ref 140–400)
RBC: 4.38 10*6/uL (ref 3.80–5.10)
RDW: 12.1 % (ref 11.0–15.0)
WBC: 4.8 10*3/uL (ref 3.8–10.8)

## 2017-01-04 LAB — LIPID PANEL
Cholesterol: 209 mg/dL — ABNORMAL HIGH (ref <200)
HDL: 81 mg/dL (ref >50)
LDL CHOLESTEROL (CALC): 111 mg/dL — AB
NON-HDL CHOLESTEROL (CALC): 128 mg/dL (ref <130)
TRIGLYCERIDES: 82 mg/dL (ref <150)
Total CHOL/HDL Ratio: 2.6 (calc) (ref <5.0)

## 2017-01-04 LAB — TSH: TSH: 2.78 m[IU]/L (ref 0.40–4.50)

## 2017-01-04 NOTE — Progress Notes (Signed)
Subjective:     Julia Perez is a 61 y.o. female and is here for a comprehensive physical exam. The patient reports no problems.  She is not actively exercising currently.  Social History   Socioeconomic History  . Marital status: Married    Spouse name: Elige RadonBradley  . Number of children: 6  . Years of education: Not on file  . Highest education level: Not on file  Social Needs  . Financial resource strain: Not on file  . Food insecurity - worry: Not on file  . Food insecurity - inability: Not on file  . Transportation needs - medical: Not on file  . Transportation needs - non-medical: Not on file  Occupational History  . Occupation: hairstylist  Tobacco Use  . Smoking status: Never Smoker  . Smokeless tobacco: Never Used  Substance and Sexual Activity  . Alcohol use: No  . Drug use: No  . Sexual activity: Yes    Partners: Male  Other Topics Concern  . 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 She is married to [place]ley with 6 children. Her youngest daughter and third some of the home. 7 pregnancies and 6 deliveries with one miscarriage.   Health Maintenance  Topic Date Due  . INFLUENZA VACCINE  09/27/2016  . MAMMOGRAM  12/15/2017  . TETANUS/TDAP  03/02/2018  . COLONOSCOPY  07/29/2023  . Hepatitis C Screening  Completed  . HIV Screening  Completed    The following portions of the patient's history were reviewed and updated as appropriate: allergies, current medications, past family history, past medical history, past social history, past surgical history and problem list.  Review of Systems A comprehensive review of systems was negative.   Objective:    BP 120/69   Pulse 66   Ht 5\' 5"  (1.651 m)   Wt 188 lb (85.3 kg)   BMI 31.28 kg/m  General appearance: alert, cooperative and appears stated age Head: Normocephalic, without obvious abnormality, atraumatic Eyes: conj clear, EOMI,  PEERLA Ears: normal TM's and external ear canals both ears Nose: Nares normal. Septum midline. Mucosa normal. No drainage or sinus tenderness. Throat: lips, mucosa, and tongue normal; teeth and gums normal Neck: no adenopathy, no carotid bruit, no JVD, supple, symmetrical, trachea midline and thyroid not enlarged, symmetric, no tenderness/mass/nodules Back: symmetric, no curvature. ROM normal. No CVA tenderness. Lungs: clear to auscultation bilaterally Heart: regular rate and rhythm, S1, S2 normal, no murmur, click, rub or gallop Abdomen: soft, non-tender; bowel sounds normal; no masses,  no organomegaly Extremities: extremities normal, atraumatic, no cyanosis or edema Pulses: 2+ and symmetric Skin: Skin color, texture, turgor normal. No rashes or lesions Lymph nodes: Cervical adenopathy: nl and Supraclavicular adenopathy: nl Neurologic: Alert and oriented X 3, normal strength and tone. Normal symmetric reflexes. Normal coordination and gait    Assessment:    Healthy female exam.      Plan:     See After Visit Summary for Counseling Recommendations   Keep up a regular exercise program and make sure you are eating a healthy diet Try to eat 4 servings of dairy a day, or if you are lactose intolerant take a calcium with vitamin D daily.  Your vaccines are up to date.   Due for Tdap and flu shot

## 2017-01-04 NOTE — Patient Instructions (Addendum)

## 2017-03-09 ENCOUNTER — Encounter: Payer: Self-pay | Admitting: Family Medicine

## 2017-06-28 ENCOUNTER — Ambulatory Visit (INDEPENDENT_AMBULATORY_CARE_PROVIDER_SITE_OTHER): Payer: BLUE CROSS/BLUE SHIELD | Admitting: Family Medicine

## 2017-06-28 VITALS — BP 137/72 | HR 69

## 2017-06-28 DIAGNOSIS — Z23 Encounter for immunization: Secondary | ICD-10-CM

## 2017-06-28 NOTE — Progress Notes (Signed)
Pt came into clinic today for second Shingrix vaccine. Pt reports no negative side effects from first immunization. Pt tolerated injection in right deltoid well, no immediate complications. Advised to follow up with PCP as directed.

## 2017-06-29 NOTE — Progress Notes (Signed)
Agree with documentation as above.   Anibal Quinby, MD  

## 2017-09-14 ENCOUNTER — Ambulatory Visit: Payer: BLUE CROSS/BLUE SHIELD | Admitting: Family Medicine

## 2017-09-14 ENCOUNTER — Encounter: Payer: Self-pay | Admitting: Family Medicine

## 2017-09-14 VITALS — BP 131/63 | HR 77 | Ht 65.0 in | Wt 192.0 lb

## 2017-09-14 DIAGNOSIS — M79672 Pain in left foot: Secondary | ICD-10-CM | POA: Diagnosis not present

## 2017-09-14 DIAGNOSIS — M722 Plantar fascial fibromatosis: Secondary | ICD-10-CM | POA: Diagnosis not present

## 2017-09-14 DIAGNOSIS — R202 Paresthesia of skin: Secondary | ICD-10-CM | POA: Diagnosis not present

## 2017-09-14 NOTE — Progress Notes (Signed)
Subjective:    Patient ID: Julia Perez, female    DOB: Jul 12, 1955, 62 y.o.   MRN: [id number]  HPI 62 old female comes in today complaining of left foot pain. Having painbon the top of her left foot.  She says on Wednesday she had a sudden pain across the top of her foot near the ankle and then looked down and realized that it was swollen medially and laterally.  It was swollen so starting wearing her compression stocking and elevated her foot. No known trauma or injury. Normal ROM.  [place] so was worried about the possibility of a blood clot.  Today she says the swelling is better and her pain is actually gone she is able to walk on it without any pain or difficulty.  She did notice a little bruising medially that is now present.  No worsening or alleviating factors.  Also c/o of heel pain on her right foot.  She says is been going on for about 4 to 5 months mostly over the base of the heel.  She says it will flare and then seem to get a little better and then seemed to get worse again.  She has been trying to ice it some.  She thinks it could be plantar fasciitis.  No trauma or injury.  He also wanted to let me know that she occasionally feels a little bit of a creepy crawly sensation on her left outer shoulder and sometimes on the distal arm.  It comes and goes is not painful or bothersome and does not feel numb but just would like a crawling sensation.  She says she is not sure if it is a muscle spasm or something else going on.  Review of Systems  BP 131/63   Pulse 77   Ht 5\' 5"  (1.651 m)   Wt 192 lb (87.1 kg)   SpO2 99%   BMI 31.95 kg/m     Allergies  Allergen Reactions  . Sulfa Antibiotics Rash    No past medical history on file.  Past Surgical History:  Procedure Laterality Date  . bladder tac  1988  . Endoscopic pregnancy  1998  . HERNIA REPAIR  1959  . VAGINAL HYSTERECTOMY  1998    Social History   Socioeconomic History  . Marital  status: Married    Spouse name: Elige RadonBradley  . Number of children: 6  . Years of education: Not on file  . Highest education level: Not on file  Occupational History  . Occupation: Scientist, research (medical)hairstylist  Social Needs  . Financial resource strain: Not on file  . Food insecurity:    Worry: Not on file    Inability: Not on file  . Transportation needs:    Medical: Not on file    Non-medical: Not on file  Tobacco Use  . Smoking status: Never Smoker  . Smokeless tobacco: Never Used  Substance and Sexual Activity  . Alcohol use: No  . Drug use: No  . Sexual activity: Yes    Partners: Male  Lifestyle  . Physical activity:    Days per week: Not on file    Minutes per session: Not on file  . Stress: Not on file  Relationships  . Social connections:    Talks on phone: Not on file    Gets together: Not on file    Attends religious service: Not on file    Active member of club or organization: Not on file  Attends meetings of clubs or organizations: Not on file    Relationship status: Not on file  . Intimate partner violence:    Fear of current or ex partner: Not on file    Emotionally abused: Not on file    Physically abused: Not on file    Forced sexual activity: Not on file  Other Topics Concern  . 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 She is married to Trinity with 6 children. Her youngest daughter and third some of the home. 7 pregnancies and 6 deliveries with one miscarriage.    Family History  Problem Relation Age of Onset  . Breast cancer Mother 29       Postmenopausal  . Diabetes Mellitus II Mother   . Heart attack Father 17    No outpatient encounter medications on file as of 09/14/2017.   No facility-administered encounter medications on file as of 09/14/2017.          Objective:   Physical Exam  Constitutional: She is oriented to person, place, and time. She appears well-developed and  well-nourished.  HENT:  Head: Normocephalic and atraumatic.  Eyes: Conjunctivae and EOM are normal.  Cardiovascular: Normal rate.  Pulmonary/Chest: Effort normal.  Musculoskeletal:  Left foot with no significant swelling erythema or rash.  Ankle with normal range of motion and strength is 5 out of 5 in all directions.  No increased laxity.  Dorsal pedal pulses 2+.  Capillary refill is less than 3 seconds.  Nontender along the metatarsal heads and around the medial lateral malleolus.  She points to the top of the foot just distal to the ankle over the top of the foot where her pain was.  She does have a little bit of bruising just distal to the medial malleolus.  Neurological: She is alert and oriented to person, place, and time.  Skin: Skin is dry. No pallor.  Psychiatric: She has a normal mood and affect. Her behavior is normal.  Vitals reviewed.       Assessment & Plan:  Left foot pain-unclear etiology.  Her swelling seems to be resolved and she is actually pain-free today.  Her exam was completely normal also gave her reassurance.  If it returns and we will plan to do an x-ray of the left foot.  Right plantar fasciitis-discussed treatment options.  Recommend icing massage, stretches with dorsiflexion, and possibly night splints.  She does usually wear good comfortable supportive shoes.  She might even try the heel cups.  If not improving over the next 4 to 6 weeks then consider follow-up 1 of our sports medicine doctors for a plantar fascia injection.  Paresthesias on the left upper arm-I encouraged her to look at the skin next time and if she actually sees the skin moving her that it can be a sign of muscle contractions which could be consistent with fasciculations.  If she does not see any movement of the muscle then it could just be paresthesias.  It could be from a pinched nerve from her neck that is causing the sensation of the right now that spot painful and is intermittent and is not  persistent so no further work-up at this point in time.

## 2018-01-07 ENCOUNTER — Ambulatory Visit (INDEPENDENT_AMBULATORY_CARE_PROVIDER_SITE_OTHER): Payer: BLUE CROSS/BLUE SHIELD | Admitting: Family Medicine

## 2018-01-07 ENCOUNTER — Encounter: Payer: Self-pay | Admitting: Family Medicine

## 2018-01-07 VITALS — BP 136/62 | HR 74 | Ht 65.06 in | Wt 193.0 lb

## 2018-01-07 DIAGNOSIS — Z23 Encounter for immunization: Secondary | ICD-10-CM

## 2018-01-07 DIAGNOSIS — Z1231 Encounter for screening mammogram for malignant neoplasm of breast: Secondary | ICD-10-CM

## 2018-01-07 DIAGNOSIS — Z0000 Encounter for general adult medical examination without abnormal findings: Secondary | ICD-10-CM | POA: Diagnosis not present

## 2018-01-07 NOTE — Progress Notes (Signed)
Subjective:     Julia Perez is a 62 y.o. female and is here for a comprehensive physical exam. The patient reports no problems.  Social History   Socioeconomic History  . Marital status: Married    Spouse name: [name]  . Number of children: 6  . Years of education: Not on file  . Highest education level: Not on file  Occupational History  . Occupation: Scientist, research (medical)  Social Needs  . Financial resource strain: Not on file  . Food insecurity:    Worry: Not on file    Inability: Not on file  . Transportation needs:    Medical: Not on file    Non-medical: Not on file  Tobacco Use  . Smoking status: Never Smoker  . Smokeless tobacco: Never Used  Substance and Sexual Activity  . Alcohol use: No  . Drug use: No  . Sexual activity: Yes    Partners: Male  Lifestyle  . Physical activity:    Days per week: Not on file    Minutes per session: Not on file  . Stress: Not on file  Relationships  . Social connections:    Talks on phone: Not on file    Gets together: Not on file    Attends religious service: Not on file    Active member of club or organization: Not on file    Attends meetings of clubs or organizations: Not on file    Relationship status: Not on file  . Intimate partner violence:    Fear of current or ex partner: Not on file    Emotionally abused: Not on file    Physically abused: Not on file    Forced sexual activity: Not on file  Other Topics Concern  . 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 She is married to [place] with 6 children. Her youngest daughter and third some of the home. 7 pregnancies and 6 deliveries with one miscarriage.   Health Maintenance  Topic Date Due  . MAMMOGRAM  12/20/2018  . COLONOSCOPY  07/29/2023  . TETANUS/TDAP  01/08/2028  . INFLUENZA VACCINE  Completed  . Hepatitis C Screening  Completed  . HIV Screening  Completed    The following portions of the  patient's history were reviewed and updated as appropriate: allergies, current medications, past family history, past medical history, past social history, past surgical history and problem list.  Review of Systems A comprehensive review of systems was negative.   Objective:    BP 136/62   Pulse 74   Ht 5' 5.06" (1.653 m)   Wt 193 lb (87.5 kg)   SpO2 98%   BMI 32.06 kg/m  General appearance: alert, cooperative and appears stated age Head: Normocephalic, without obvious abnormality, atraumatic Eyes: conj clear EOMI, PEERLA Ears: normal TM's and external ear canals both ears Nose: Nares normal. Septum midline. Mucosa normal. No drainage or sinus tenderness. Throat: lips, mucosa, and tongue normal; teeth and gums normal Neck: no adenopathy, no carotid bruit, no JVD, supple, symmetrical, trachea midline and thyroid not enlarged, symmetric, no tenderness/mass/nodules Back: symmetric, no curvature. ROM normal. No CVA tenderness. Lungs: clear to auscultation bilaterally Heart: regular rate and rhythm, S1, S2 normal, no murmur, click, rub or gallop Abdomen: soft, non-tender; bowel sounds normal; no masses,  no organomegaly Extremities: extremities normal, atraumatic, no cyanosis or edema Pulses: 2+ and symmetric Skin: Skin color, texture, turgor normal. No rashes or lesions Lymph  nodes: Cervical, supraclavicular, and axillary nodes normal. Neurologic: Alert and oriented X 3, normal strength and tone. Normal symmetric reflexes. Normal coordination and gait    Assessment:    Healthy female exam.      Plan:     See After Visit Summary for Counseling Recommendations   Keep up a regular exercise program and make sure you are eating a healthy diet Try to eat 4 servings of dairy a day, or if you are lactose intolerant take a calcium with vitamin D daily.  Your vaccines are up to date.

## 2018-01-07 NOTE — Patient Instructions (Signed)

## 2018-01-08 LAB — CBC
HEMATOCRIT: 41.2 % (ref 35.0–45.0)
HEMOGLOBIN: 13.8 g/dL (ref 11.7–15.5)
MCH: 29.9 pg (ref 27.0–33.0)
MCHC: 33.5 g/dL (ref 32.0–36.0)
MCV: 89.2 fL (ref 80.0–100.0)
MPV: 9.7 fL (ref 7.5–12.5)
Platelets: 273 10*3/uL (ref 140–400)
RBC: 4.62 10*6/uL (ref 3.80–5.10)
RDW: 12.3 % (ref 11.0–15.0)
WBC: 5 10*3/uL (ref 3.8–10.8)

## 2018-01-08 LAB — COMPLETE METABOLIC PANEL WITH GFR
AG Ratio: 1.9 (calc) (ref 1.0–2.5)
ALBUMIN MSPROF: 4.5 g/dL (ref 3.6–5.1)
ALT: 19 U/L (ref 6–29)
AST: 18 U/L (ref 10–35)
Alkaline phosphatase (APISO): 76 U/L (ref 33–130)
BUN: 11 mg/dL (ref 7–25)
CALCIUM: 9.4 mg/dL (ref 8.6–10.4)
CO2: 30 mmol/L (ref 20–32)
CREATININE: 0.8 mg/dL (ref 0.50–0.99)
Chloride: 103 mmol/L (ref 98–110)
GFR, EST NON AFRICAN AMERICAN: 78 mL/min/{1.73_m2} (ref >=60)
GFR, Est African American: 90 mL/min/{1.73_m2} (ref >=60)
GLOBULIN: 2.4 g/dL (ref 1.9–3.7)
GLUCOSE: 92 mg/dL (ref 65–99)
Potassium: 4 mmol/L (ref 3.5–5.3)
SODIUM: 140 mmol/L (ref 135–146)
Total Bilirubin: 0.7 mg/dL (ref 0.2–1.2)
Total Protein: 6.9 g/dL (ref 6.1–8.1)

## 2018-01-08 LAB — HM MAMMOGRAPHY

## 2018-01-08 LAB — LIPID PANEL
Cholesterol: 230 mg/dL — ABNORMAL HIGH (ref <200)
HDL: 71 mg/dL (ref >50)
LDL CHOLESTEROL (CALC): 137 mg/dL — AB
NON-HDL CHOLESTEROL (CALC): 159 mg/dL — AB (ref <130)
Total CHOL/HDL Ratio: 3.2 (calc) (ref <5.0)
Triglycerides: 111 mg/dL (ref <150)

## 2018-01-10 ENCOUNTER — Encounter: Payer: Self-pay | Admitting: Sports Medicine

## 2018-01-10 ENCOUNTER — Ambulatory Visit (INDEPENDENT_AMBULATORY_CARE_PROVIDER_SITE_OTHER): Payer: BLUE CROSS/BLUE SHIELD | Admitting: Sports Medicine

## 2018-01-10 DIAGNOSIS — M65342 Trigger finger, left ring finger: Secondary | ICD-10-CM

## 2018-01-10 NOTE — Progress Notes (Signed)
Subjective:    I'm seeing this patient as a consultation for: Dr. 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  Symptoms are moderate, persistent, localized without radiation.  I reviewed the past medical history, family history, social history, surgical history, and allergies today and no changes were needed.  Please see the problem list section below in epic for further details.  Past Medical History: No past medical history on file. Past Surgical History: Past Surgical History:  Procedure Laterality Date  . bladder tac  1988  . Endoscopic pregnancy  1998  . HERNIA REPAIR  1959  . VAGINAL HYSTERECTOMY  1998   Social History: Social History   Socioeconomic History  . Marital status: Married    Spouse name: Elige RadonBradley  . Number of children: 6  . Years of education: Not on file  . Highest education level: Not on file  Occupational History  . Occupation: Scientist, research (medical)hairstylist  Social Needs  . Financial resource strain: Not on file  . Food insecurity:    Worry: Not on file    Inability: Not on file  . Transportation needs:    Medical: Not on file    Non-medical: Not on file  Tobacco Use  . Smoking status: Never Smoker  . Smokeless tobacco: Never Used  Substance and Sexual Activity  . Alcohol use: No  . Drug use: No  . Sexual activity: Yes    Partners: Male  Lifestyle  . Physical activity:    Days per week: Not on file    Minutes per session: Not on file  . Stress: Not on file  Relationships  . Social connections:    Talks on phone: Not on file    Gets together: Not on file    Attends religious service: Not on file    Active member of club or organization: Not on file    Attends meetings of clubs or organizations: Not on file   Relationship status: Not on file  Other Topics Concern  . 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 She is married to [place] with 6 children. Her youngest daughter and third some of the home. 7 pregnancies and 6 deliveries with one miscarriage.   Family History: Family History  Problem Relation Age of Onset  . Breast cancer Mother 4255       Postmenopausal  . Diabetes Mellitus II Mother   . Heart attack Father 5640   Allergies: Allergies  Allergen Reactions  . Sulfa Antibiotics Rash and Itching   Medications: See med rec.  Review of Systems: No headache, visual changes, nausea, vomiting, diarrhea, constipation, dizziness, abdominal pain, skin rash, fevers, chills, night sweats, weight loss, swollen lymph nodes, body aches, joint swelling, muscle aches, chest pain, shortness of breath, mood changes, visual or auditory hallucinations.   Objective:   General: Well Developed, well nourished, and in no acute distress.  Neuro:  Extra-ocular muscles intact, able to move all 4 extremities, sensation grossly intact.  Deep tendon reflexes tested were normal. Psych: Alert and oriented, mood congruent with affect. ENT:  Ears and nose appear unremarkable.  Hearing grossly normal. Neck: Unremarkable overall appearance, trachea midline.  No visible thyroid enlargement. Eyes:  Conjunctivae and lids appear unremarkable.  Pupils equal and round. Skin: Warm and dry, no rashes noted.  Cardiovascular: Pulses palpable, no extremity edema. Left hand: Tender to palpation over the fourth flexor tendon sheath, palpable flexor tendon nodule, visible triggering.  Procedure: Real-time Ultrasound Guided Injection of left fourth flexor tendon sheath Device: GE Logiq E  Verbal informed consent obtained.  Time-out conducted.  Noted no overlying erythema, induration, or other signs of local infection.  Skin prepped in a sterile fashion.  Local  anesthesia: Topical Ethyl chloride.  With sterile technique and under real time ultrasound guidance: 25-gauge needle advanced into the flexor tendon sheath and injected 1/2 cc Kenalog 40, 1/2 cc lidocaine. Completed without difficulty  Pain immediately resolved suggesting accurate placement of the medication.  Advised to call if fevers/chills, erythema, induration, drainage, or persistent bleeding.  Images permanently stored and available for review in the ultrasound unit.  Impression: Technically successful ultrasound guided injection.  Impression and Recommendations:   This case required medical decision making of moderate complexity.  Trigger finger, left ring finger Left fourth flexor tendon sheath injection as above, rehab exercises given, return to see me in a month. ___________________________________________ Ihor Austin. Benjamin Stain, M.D., ABFM., CAQSM. Primary Care and Sports Medicine [facility]  Adjunct Professor of Family Medicine  [employer]

## 2018-01-10 NOTE — Assessment & Plan Note (Signed)
Left fourth flexor tendon sheath injection as above, rehab exercises given, return to see me in a month.

## 2018-02-07 ENCOUNTER — Ambulatory Visit: Payer: BLUE CROSS/BLUE SHIELD | Admitting: Sports Medicine

## 2018-02-07 ENCOUNTER — Encounter: Payer: Self-pay | Admitting: Sports Medicine

## 2018-02-07 DIAGNOSIS — M65342 Trigger finger, left ring finger: Secondary | ICD-10-CM

## 2018-02-07 NOTE — Assessment & Plan Note (Signed)
Good resolution of triggering and pain after left fourth flexor tendon sheath injection as above. Return to see me on an as-needed basis.

## 2018-02-07 NOTE — Progress Notes (Signed)
Subjective:    CC: Follow-up  HPI: This is a very pleasant 62 year old female, we treated her a month ago for a left fourth flexor tenosynovitis, she had triggering and pain.  We did an injection and she returns today symptom-free.  I reviewed the past medical history, family history, social history, surgical history, and allergies today and no changes were needed.  Please see the problem list section below in epic for further details.  Past Medical History: No past medical history on file. Past Surgical History: Past Surgical History:  Procedure Laterality Date  . bladder tac  1988  . Endoscopic pregnancy  1998  . HERNIA REPAIR  1959  . VAGINAL HYSTERECTOMY  1998   Social History: Social History   Socioeconomic History  . Marital status: Married    Spouse name: Elige RadonBradley  . Number of children: 6  . Years of education: Not on file  . Highest education level: Not on file  Occupational History  . Occupation: Scientist, research (medical)hairstylist  Social Needs  . Financial resource strain: Not on file  . Food insecurity:    Worry: Not on file    Inability: Not on file  . Transportation needs:    Medical: Not on file    Non-medical: Not on file  Tobacco Use  . Smoking status: Never Smoker  . Smokeless tobacco: Never Used  Substance and Sexual Activity  . Alcohol use: No  . Drug use: No  . Sexual activity: Yes    Partners: Male  Lifestyle  . Physical activity:    Days per week: Not on file    Minutes per session: Not on file  . Stress: Not on file  Relationships  . Social connections:    Talks on phone: Not on file    Gets together: Not on file    Attends religious service: Not on file    Active member of club or organization: Not on file    Attends meetings of clubs or organizations: Not on file    Relationship status: Not on file  Other Topics Concern  . 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 She is married to  [place] with 6 children. Her youngest daughter and third some of the home. 7 pregnancies and 6 deliveries with one miscarriage.   Family History: Family History  Problem Relation Age of Onset  . Breast cancer Mother 5155       Postmenopausal  . Diabetes Mellitus II Mother   . Heart attack Father 5740   Allergies: Allergies  Allergen Reactions  . Sulfa Antibiotics Rash and Itching   Medications: See med rec.  Review of Systems: No fevers, chills, night sweats, weight loss, chest pain, or shortness of breath.   Objective:    General: Well Developed, well nourished, and in no acute distress.  Neuro: Alert and oriented x3, extra-ocular muscles intact, sensation grossly intact.  HEENT: Normocephalic, atraumatic, pupils equal round reactive to light, neck supple, no masses, no lymphadenopathy, thyroid nonpalpable.  Skin: Warm and dry, no rashes. Cardiac: Regular rate and rhythm, no murmurs rubs or gallops, no lower extremity edema.  Respiratory: Clear to auscultation bilaterally. Not using accessory muscles, speaking in full sentences.  Impression and Recommendations:    Trigger finger, left ring finger Good resolution of triggering and pain after left fourth flexor tendon sheath injection as above. Return to see me on an as-needed basis. ___________________________________________ Ihor Austinhomas J. Benjamin Stainhekkekandam, M.D., ABFM., CAQSM. Primary  Care and [place] Professor of [place] [employer] [facility] [employer]

## 2018-02-14 ENCOUNTER — Encounter: Payer: Self-pay | Admitting: Family Medicine

## 2018-03-21 ENCOUNTER — Encounter: Payer: Self-pay | Admitting: Physician Assistant

## 2018-03-21 ENCOUNTER — Ambulatory Visit (INDEPENDENT_AMBULATORY_CARE_PROVIDER_SITE_OTHER): Payer: BLUE CROSS/BLUE SHIELD | Admitting: Physician Assistant

## 2018-03-21 ENCOUNTER — Ambulatory Visit (INDEPENDENT_AMBULATORY_CARE_PROVIDER_SITE_OTHER): Payer: BLUE CROSS/BLUE SHIELD

## 2018-03-21 VITALS — BP 141/94 | HR 76 | Wt 194.0 lb

## 2018-03-21 DIAGNOSIS — M940 Chondrocostal junction syndrome [Tietze]: Secondary | ICD-10-CM | POA: Diagnosis not present

## 2018-03-21 DIAGNOSIS — M546 Pain in thoracic spine: Secondary | ICD-10-CM

## 2018-03-21 DIAGNOSIS — R0781 Pleurodynia: Secondary | ICD-10-CM

## 2018-03-21 DIAGNOSIS — W19XXXA Unspecified fall, initial encounter: Secondary | ICD-10-CM | POA: Diagnosis not present

## 2018-03-21 NOTE — Patient Instructions (Signed)
Continue Ibuprofen 400-800 mg every 6 hours as needed for pain Continue to apply heat for 20-30 minutes every 3-4 hours as needed   Costochondritis  Costochondritis is swelling and irritation (inflammation) of the tissue (cartilage) that connects your ribs to your breastbone (sternum). This causes pain in the front of your chest. The pain usually starts gradually and involves more than one rib. What are the causes? The exact cause of this condition is not always known. It results from stress on the cartilage where your ribs attach to your sternum. The cause of this stress could be:  Chest injury (trauma).  Exercise or activity, such as lifting.  Severe coughing. What increases the risk? You may be at higher risk for this condition if you:  Are female.  Are 65?65 years old.  Recently started a new exercise or work activity.  Have low levels of vitamin D.  Have a condition that makes you cough frequently. What are the signs or symptoms? The main symptom of this condition is chest pain. The pain:  Usually starts gradually and can be sharp or dull.  Gets worse with deep breathing, coughing, or exercise.  Gets better with rest.  May be worse when you press on the sternum-rib connection (tenderness). How is this diagnosed? This condition is diagnosed based on your symptoms, medical history, and a physical exam. Your health care provider will check for tenderness when pressing on your sternum. This is the most important finding. You may also have tests to rule out other causes of chest pain. These may include:  A chest X-ray to check for lung problems.  An electrocardiogram (ECG) to see if you have a heart problem that could be causing the pain.  An imaging scan to rule out a chest or rib fracture. How is this treated? This condition usually goes away on its own over time. Your health care provider may prescribe an NSAID to reduce pain and inflammation. Your health care  provider may also suggest that you:  Rest and avoid activities that make pain worse.  Apply heat or cold to the area to reduce pain and inflammation.  Do exercises to stretch your chest muscles. If these treatments do not help, your health care provider may inject a numbing medicine at the sternum-rib connection to help relieve the pain. Follow these instructions at home:  Avoid activities that make pain worse. This includes any activities that use chest, abdominal, and side muscles.  If directed, put ice on the painful area: ? Put ice in a plastic bag. ? Place a towel between your skin and the bag. ? Leave the ice on for 20 minutes, 2-3 times a day.  If directed, apply heat to the affected area as often as told by your health care provider. Use the heat source that your health care provider recommends, such as a moist heat pack or a heating pad. ? Place a towel between your skin and the heat source. ? Leave the heat on for 20-30 minutes. ? Remove the heat if your skin turns bright red. This is especially important if you are unable to feel pain, heat, or cold. You may have a greater risk of getting burned.  Take over-the-counter and prescription medicines only as told by your health care provider.  Return to your normal activities as told by your health care provider. Ask your health care provider what activities are safe for you.  Keep all follow-up visits as told by your health care provider. This  is important. Contact a health care provider if:  You have chills or a fever.  Your pain does not go away or it gets worse.  You have a cough that does not go away (is persistent). Get help right away if:  You have shortness of breath. This information is not intended to replace advice given to you by your health care provider. Make sure you discuss any questions you have with your health care provider. Document Released: 11/23/2004 Document Revised: 11/15/2016 Document Reviewed:  06/09/2015 Elsevier Interactive Patient Education  [employer].

## 2018-03-21 NOTE — Progress Notes (Signed)
HPI:                                                                Julia Perez is a 63 y.o. female who presents to [facility]: Primary Care Sports Medicine today for fall  Patient [place]. Reports tripping on a root with left foot, falling forward with outstretched arms and landing on her left chest/breast. She is now having left breast/rib pain and mid-back pain. Pain is worth with coughing and taking deep breathes. Pain is worse at night.  Has been taking Ibuprofen 400 mg and applying heating pad, which has been helpful. No hx of osteoporosis or fractures. No prior history of falls.   History reviewed. No pertinent past medical history. Past Surgical History:  Procedure Laterality Date  . bladder tac  1988  . Endoscopic pregnancy  1998  . HERNIA REPAIR  1959  . VAGINAL HYSTERECTOMY  1998   Social History   Tobacco Use  . Smoking status: Never Smoker  . Smokeless tobacco: Never Used  Substance Use Topics  . Alcohol use: No   family history includes Breast cancer (age of onset: 39) in her mother; Diabetes Mellitus II in her mother; Heart attack (age of onset: 53) in her father.    ROS: negative except as noted in the HPI  Medications: No current outpatient medications on file.   No current facility-administered medications for this visit.    Allergies  Allergen Reactions  . Sulfa Antibiotics Rash and Itching       Objective:  BP (!) 141/94   Pulse 76   Wt 194 lb (88 kg)   BMI 32.22 kg/m  Gen:  alert, not ill-appearing, no distress, appropriate for age, obese female HEENT: head normocephalic without obvious abnormality, conjunctiva and cornea clear, trachea midline Pulm: Normal work of breathing, normal phonation, clear to auscultation bilaterally, no wheezes, rales or rhonchi CV: Normal rate, regular rhythm, s1 and s2 distinct, no murmurs, clicks or rubs  Neuro: alert and oriented x  3, no tremor MSK: extremities atraumatic, normal gait and station Chest wall: tenderness of the left anterior lower ribs, no deformity Back: atraumatic, no midline tenderness, tenderness of the bilateral thoracic back approx. T5-T7 area Skin: intact, no ecchymoses or hematoma  No results found for this or any previous visit (from the past 72 hour(s)). No results found.    Assessment and Plan: 63 y.o. female with   .Ashani was seen today for fall.  Diagnoses and all orders for this visit:  Acute costochondritis  Fall from standing, initial encounter  Rib pain on left side -     DG Ribs Unilateral W/Chest Left  Acute bilateral thoracic back pain -     DG Thoracic Spine W/Swimmers   X-rays pending to assess for fracture Counseled on supportive care for acute costochondritis and musculoskeletal back pain Patient declined prescription anti-inflammatory Continue Ibuprofen 400-800 mg every 6 hours as needed for pain Continue to apply heat for 20-30 minutes every 3-4 hours as needed   Patient education and anticipatory guidance given Patient agrees with treatment plan Follow-up as needed if symptoms worsen or fail to improve  Levonne Hubert PA-C

## 2018-11-01 ENCOUNTER — Ambulatory Visit (INDEPENDENT_AMBULATORY_CARE_PROVIDER_SITE_OTHER): Payer: BLUE CROSS/BLUE SHIELD | Admitting: Family Medicine

## 2018-11-01 ENCOUNTER — Encounter: Payer: Self-pay | Admitting: *Deleted

## 2018-11-01 ENCOUNTER — Other Ambulatory Visit: Payer: Self-pay

## 2018-11-01 ENCOUNTER — Encounter: Payer: Self-pay | Admitting: Family Medicine

## 2018-11-01 VITALS — BP 134/74 | HR 78 | Temp 98.4°F | Ht 65.0 in | Wt 179.0 lb

## 2018-11-01 DIAGNOSIS — Z8701 Personal history of pneumonia (recurrent): Secondary | ICD-10-CM | POA: Insufficient documentation

## 2018-11-01 DIAGNOSIS — Z23 Encounter for immunization: Secondary | ICD-10-CM

## 2018-11-01 DIAGNOSIS — M545 Low back pain, unspecified: Secondary | ICD-10-CM

## 2018-11-01 DIAGNOSIS — R319 Hematuria, unspecified: Secondary | ICD-10-CM

## 2018-11-01 LAB — POCT URINALYSIS DIPSTICK
Bilirubin, UA: NEGATIVE
Glucose, UA: NEGATIVE
Ketones, UA: NEGATIVE
Nitrite, UA: NEGATIVE
Protein, UA: NEGATIVE
Spec Grav, UA: 1.01 (ref 1.010–1.025)
Urobilinogen, UA: 0.2 E.U./dL
pH, UA: 6.5 (ref 5.0–8.0)

## 2018-11-01 NOTE — Progress Notes (Addendum)
Acute Office Visit  Subjective:    Patient ID: Julia Perez, female    DOB: 1955-10-07, 63 y.o.   MRN: [id number]  Chief Complaint  Patient presents with  . Back Pain    x 2wks    HPI Patient is in today for right-sided low back pain x 2 weeks. No trauma or injury. Thought maybe a urinary problems. Says tried to drink more water. Didn't try any medication.  Still doing her normal activities.  No dysuria or hematuria.  No fever, sweats or chills or nausea of vomiting or diarrhea.  She says at the worst it is been a 3 out of 10 but today it actually feels a little bit better.  He has been trying to stretch as well.  She says it feels a little bit more tight when she flexes forward but is not significant.  She has not had any pelvic pressure or low back pain.  Describes pain is persistent.  History reviewed. No pertinent past medical history.  Past Surgical History:  Procedure Laterality Date  . bladder tac  1988  . Endoscopic pregnancy  1998  . HERNIA REPAIR  1959  . VAGINAL HYSTERECTOMY  1998    Family History  Problem Relation Age of Onset  . Breast cancer Mother 16       Postmenopausal  . Diabetes Mellitus II Mother   . Heart attack Father 70    Social History   Socioeconomic History  . Marital status: Married    Spouse name: Leory Plowman  . Number of children: 6  . Years of education: Not on file  . Highest education level: Not on file  Occupational History  . Occupation: Haematologist  Social Needs  . Financial resource strain: Not on file  . Food insecurity    Worry: Not on file    Inability: Not on file  . Transportation needs    Medical: Not on file    Non-medical: Not on file  Tobacco Use  . Smoking status: Never Smoker  . Smokeless tobacco: Never Used  Substance and Sexual Activity  . Alcohol use: No  . Drug use: No  . Sexual activity: Yes    Partners: Male  Lifestyle  . Physical activity    Days per week: Not on file    Minutes per session: Not  on file  . Stress: Not on file  Relationships  . Social Herbalist on phone: Not on file    Gets together: Not on file    Attends religious service: Not on file    Active member of club or organization: Not on file    Attends meetings of clubs or organizations: Not on file    Relationship status: Not on file  . Intimate partner violence    Fear of current or ex partner: Not on file    Emotionally abused: Not on file    Physically abused: Not on file    Forced sexual activity: Not on file  Other Topics Concern  . 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 She is married to [place] with 6 children. Her youngest daughter and third some of the home. 7 pregnancies and 6 deliveries with one miscarriage.    No outpatient medications prior to visit.   No facility-administered medications prior to visit.     Allergies  Allergen Reactions  . Sulfa Antibiotics Rash and Itching  ROS     Objective:    Physical Exam  Constitutional: She is oriented to person, place, and time. She appears well-developed and well-nourished.  HENT:  Head: Normocephalic and atraumatic.  Eyes: Conjunctivae and EOM are normal.  Cardiovascular: Normal rate.  Pulmonary/Chest: Effort normal.  Musculoskeletal:     Comments: 1.  Lumbar flexion, extension, rotation right and left.  Normal side bending.  Nontender over the thoracic or lumbar spine.  Just slightly tender on the left side over the paraspinous muscles just over the ribs.  No palpable knots or deep tenderness.  Negative straight leg raise bilaterally.  Hip, knee, ankle strength is 5-5.  Patellar reflexes are 0+ bilaterally  Neurological: She is alert and oriented to person, place, and time.  Skin: Skin is dry. No pallor.  Psychiatric: She has a normal mood and affect. Her behavior is normal.  Vitals reviewed.   BP 134/74   Pulse 78   Temp 98.4 F (36.9 C)   Ht 5'  5" (1.651 m)   Wt 179 lb (81.2 kg)   SpO2 100%   BMI 29.79 kg/m  Wt Readings from Last 3 Encounters:  11/01/18 179 lb (81.2 kg)  03/21/18 194 lb (88 kg)  01/07/18 193 lb (87.5 kg)    Health Maintenance Due  Topic Date Due  . INFLUENZA VACCINE  09/28/2018    There are no preventive care reminders to display for this patient.   Lab Results  Component Value Date   TSH 2.78 01/04/2017   Lab Results  Component Value Date   WBC 5.0 01/07/2018   HGB 13.8 01/07/2018   HCT 41.2 01/07/2018   MCV 89.2 01/07/2018   PLT 273 01/07/2018   Lab Results  Component Value Date   NA 140 01/07/2018   K 4.0 01/07/2018   CO2 30 01/07/2018   GLUCOSE 92 01/07/2018   BUN 11 01/07/2018   CREATININE 0.80 01/07/2018   BILITOT 0.7 01/07/2018   ALKPHOS 69 01/05/2016   AST 18 01/07/2018   ALT 19 01/07/2018   PROT 6.9 01/07/2018   ALBUMIN 4.5 01/05/2016   CALCIUM 9.4 01/07/2018   Lab Results  Component Value Date   CHOL 230 (H) 01/07/2018   Lab Results  Component Value Date   HDL 71 01/07/2018   Lab Results  Component Value Date   LDLCALC 137 (H) 01/07/2018   Lab Results  Component Value Date   TRIG 111 01/07/2018   Lab Results  Component Value Date   CHOLHDL 3.2 01/07/2018   No results found for: HGBA1C     Assessment & Plan:   Problem List Items Addressed This Visit    None    Visit Diagnoses    Right-sided low back pain without sciatica, unspecified chronicity    -  Primary   Relevant Orders   POCT urinalysis dipstick (Completed)   Urine Culture   Urinalysis, microscopic only   Need for immunization against influenza       Relevant Orders   Flu Vaccine QUAD 36+ mos IM (Completed)   Hematuria, unspecified type         Right low back pain -unclear etiology.  Could be musculoskeletal though range of motion does not really seem to bother it very much.  It is only mildly tender to palpation and she feels like it is a little deeper inside.  She did have a little bit  of hematuria on the urinalysis at work and I sent it for microscopic review  to see if there is in a significant amount of blood.  Also consider kidney stone that her pain really has been very mild.  If it gets worse over the weekend then please let us know we do have an on-call nurse.  And if she does not improve after the weekend then please let us know so that we can work it up further with either ultrasound or possible CT.  Hematuria-we will need additional work-up will send it for microscopic evaluation if there is blood there then plan will be to repeat in 1 week to see if it is persistent and if it needs further work-up.   No orders of the defined types were placed in this encounter.    Nani Gasser, MD

## 2018-11-03 LAB — URINE CULTURE
MICRO NUMBER:: 849568
SPECIMEN QUALITY:: ADEQUATE

## 2018-11-03 LAB — URINALYSIS, MICROSCOPIC ONLY
Bacteria, UA: NONE SEEN /HPF
Hyaline Cast: NONE SEEN /LPF
RBC / HPF: NONE SEEN /HPF (ref 0–2)

## 2018-11-04 ENCOUNTER — Encounter: Payer: Self-pay | Admitting: Family Medicine

## 2019-01-13 ENCOUNTER — Ambulatory Visit: Payer: BLUE CROSS/BLUE SHIELD | Admitting: Family Medicine

## 2019-01-13 ENCOUNTER — Encounter: Payer: Self-pay | Admitting: Family Medicine

## 2019-01-13 ENCOUNTER — Telehealth: Payer: Self-pay | Admitting: Family Medicine

## 2019-01-13 ENCOUNTER — Other Ambulatory Visit: Payer: Self-pay

## 2019-01-13 VITALS — BP 122/66 | HR 73 | Ht 65.0 in | Wt 173.0 lb

## 2019-01-13 DIAGNOSIS — E785 Hyperlipidemia, unspecified: Secondary | ICD-10-CM | POA: Insufficient documentation

## 2019-01-13 DIAGNOSIS — Z23 Encounter for immunization: Secondary | ICD-10-CM | POA: Diagnosis not present

## 2019-01-13 DIAGNOSIS — Z0000 Encounter for general adult medical examination without abnormal findings: Secondary | ICD-10-CM

## 2019-01-13 DIAGNOSIS — Z8241 Family history of sudden cardiac death: Secondary | ICD-10-CM | POA: Diagnosis not present

## 2019-01-13 DIAGNOSIS — Z1231 Encounter for screening mammogram for malignant neoplasm of breast: Secondary | ICD-10-CM | POA: Diagnosis not present

## 2019-01-13 NOTE — Telephone Encounter (Signed)
Please call patient: I looked at her x-rays that she had done last winter with Charlie.  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

## 2019-01-13 NOTE — Telephone Encounter (Signed)
Patient was notified on 03/21/2018 about her results.

## 2019-01-13 NOTE — Progress Notes (Signed)
Subjective:    Julia Perez is a 63 y.o. female who presents for a Welcome to Medicare exam.   She has been having some mid back pain on and off.  She is just worried that it could be heart related since her father died in his 46s of a massive heart attack and her husband recently had quadruple bypass she says been a little nervous about it.  She thinks it is probably just more related to sitting and working and work stress but wants to make sure.  She did see cardiology in the last couple years and even had a negative stress test about 2 years ago.  He says she is worked really hard to lose weight and is hopeful that her cholesterol will look better this year.  Review of Systems Comprehensive review of systems is negative. Cardiac Risk Factors include: advanced age (>80men, >20 women)      Objective:    Today's Vitals   01/13/19 0928  BP: 122/66  Pulse: 73  SpO2: 99%  Weight: 173 lb (78.5 kg)  Height: 5\' 5"  (1.651 m)  Body mass index is 28.79 kg/m.  Medications No outpatient encounter medications on file as of 01/13/2019.   No facility-administered encounter medications on file as of 01/13/2019.      History: No past medical history on file. Past Surgical History:  Procedure Laterality Date  . bladder tac  1988  . Endoscopic pregnancy  1998  . HERNIA REPAIR  1959  . VAGINAL HYSTERECTOMY  1998    Family History  Problem Relation Age of Onset  . Breast cancer Mother 86       Postmenopausal  . Diabetes Mellitus II Mother   . Heart attack Father 38   Social History   Occupational History  . Occupation: hairstylist  Tobacco Use  . Smoking status: Never Smoker  . Smokeless tobacco: Never Used  Substance and Sexual Activity  . Alcohol use: No  . Drug use: No  . Sexual activity: Yes    Partners: Male    Tobacco Counseling Counseling given: Not Answered   Immunizations and Health Maintenance Immunization History  Administered Date(s) Administered   . Influenza,inj,Quad PF,6+ Mos 01/04/2017, 01/07/2018, 11/01/2018  . Influenza-Unspecified 12/15/2015  . Tdap 03/02/2008, 01/07/2018  . Zoster 01/05/2016  . Zoster Recombinat (Shingrix) 01/04/2017, 06/28/2017   Health Maintenance Due  Topic Date Due  . DEXA SCAN  12/27/2018  . PNA vac Low Risk Adult (1 of 2 - PCV13) 12/27/2018    Activities of Daily Living In your present state of health, do you have any difficulty performing the following activities: 01/13/2019 01/13/2019  Hearing? N N  Vision? N N  Difficulty concentrating or making decisions? N N  Walking or climbing stairs? N N  Dressing or bathing? N N  Doing errands, shopping? N N  Preparing Food and eating ? - N  Using the Toilet? - N  In the past six months, have you accidently leaked urine? - N  Do you have problems with loss of bowel control? - N  Managing your Medications? - N  Managing your Finances? - N  Housekeeping or managing your Housekeeping? - N  Some recent data might be hidden    Physical Exam   Physical Exam  Constitutional: She is oriented to person, place, and time. She appears well-developed and well-nourished.  HENT:  Head: Normocephalic and atraumatic.  Right Ear: External ear normal.  Left Ear: External ear normal.  Mouth/Throat:  Oropharynx is clear and moist.  Eyes: Pupils are equal, round, and reactive to light. Conjunctivae and EOM are normal.  Neck: Neck supple. No thyromegaly present.  Cardiovascular: Normal rate and regular rhythm.  Respiratory: Effort normal and breath sounds normal. She exhibits no mass. Right breast exhibits no inverted nipple, no mass, no nipple discharge, no skin change and no tenderness. Left breast exhibits no inverted nipple, no mass, no nipple discharge, no skin change and no tenderness. Breasts are symmetrical.  GI: Soft. Bowel sounds are normal.  Musculoskeletal:        General: No edema.  Neurological: She is alert and oriented to person, place, and time.   Skin: Skin is warm.  Psychiatric: She has a normal mood and affect. Her behavior is normal.    (optional), or other factors deemed appropriate based on the beneficiary's medical and social history and current clinical standards.  Advanced Directives: Does Patient Have a Medical Advance Directive?: Yes Type of Advance Directive: Healthcare Power of Attorney, Living will Does patient want to make changes to medical advance directive?: No - Patient declined Copy of Healthcare Power of Attorney in Chart?: No - copy requested    Assessment:    This is a routine wellness examination for this patient .   Vision/Hearing screen No exam data present  Dietary issues and exercise activities discussed:  Current Exercise Habits: Home exercise routine, Type of exercise: walking, Time (Minutes): 40, Frequency (Times/Week): 2, Weekly Exercise (Minutes/Week): 80, Intensity: Mild  Goals   None    Depression Screen PHQ 2/9 Scores 01/13/2019 11/01/2018 01/04/2017  PHQ - 2 Score 1 0 0     Fall Risk Fall Risk  01/13/2019  Falls in the past year? 1  Number falls in past yr: 0  Injury with Fall? 0  Follow up Falls prevention discussed    Cognitive Function:     6CIT Screen 01/13/2019  What Year? 0 points  What month? 0 points  What time? 0 points  Count back from 20 0 points  Months in reverse 0 points  Repeat phrase 0 points  Total Score 0    Patient Care Team: Agapito Games, MD as PCP - General (Family Medicine)     Plan:   Medicare Wellness.   I have personally reviewed and noted the following in the patient's chart:   . Medical and social history . Use of alcohol, tobacco or illicit drugs  . Current medications and supplements . Functional ability and status . Nutritional status . Physical activity . Advanced directives . List of other physicians . Hospitalizations, surgeries, and ER visits in previous 12 months . Vitals . Screenings to include cognitive,  depression, and falls . Referrals and appointments . Mammogram ordered.   . EKG shows rate of 69 bpm, normal sinus rhythm with no acute ST-T wave changes.  In addition, I have reviewed and discussed with patient certain preventive protocols, quality metrics, and best practice recommendations. A written personalized care plan for preventive services as well as general preventive health recommendations were provided to patient.     Nani Gasser, MD 01/13/2019

## 2019-01-13 NOTE — Patient Instructions (Signed)
Health Maintenance, Female Adopting a healthy lifestyle and getting preventive care are important in promoting health and wellness. Ask your health care provider about:  The right schedule for you to have regular tests and exams.  Things you can do on your own to prevent diseases and keep yourself healthy. What should I know about diet, weight, and exercise? Eat a healthy diet   Eat a diet that includes plenty of vegetables, fruits, low-fat dairy products, and lean protein.  Do not eat a lot of foods that are high in solid fats, added sugars, or sodium. Maintain a healthy weight Body mass index (BMI) is used to identify weight problems. It estimates body fat based on height and weight. Your health care provider can help determine your BMI and help you achieve or maintain a healthy weight. Get regular exercise Get regular exercise. This is one of the most important things you can do for your health. Most adults should:  Exercise for at least 150 minutes each week. The exercise should increase your heart rate and make you sweat (moderate-intensity exercise).  Do strengthening exercises at least twice a week. This is in addition to the moderate-intensity exercise.  Spend less time sitting. Even light physical activity can be beneficial. Watch cholesterol and blood lipids Have your blood tested for lipids and cholesterol at 65 years of age, then have this test every 5 years. Have your cholesterol levels checked more often if:  Your lipid or cholesterol levels are high.  You are older than 65 years of age.  You are at high risk for heart disease. What should I know about cancer screening? Depending on your health history and family history, you may need to have cancer screening at various ages. This may include screening for:  Breast cancer.  Cervical cancer.  Colorectal cancer.  Skin cancer.  Lung cancer. What should I know about heart disease, diabetes, and high blood  pressure? Blood pressure and heart disease  High blood pressure causes heart disease and increases the risk of stroke. This is more likely to develop in people who have high blood pressure readings, are of African descent, or are overweight.  Have your blood pressure checked: ? Every 3-5 years if you are 18-39 years of age. ? Every year if you are 40 years old or older. Diabetes Have regular diabetes screenings. This checks your fasting blood sugar level. Have the screening done:  Once every three years after age 40 if you are at a normal weight and have a low risk for diabetes.  More often and at a younger age if you are overweight or have a high risk for diabetes. What should I know about preventing infection? Hepatitis B If you have a higher risk for hepatitis B, you should be screened for this virus. Talk with your health care provider to find out if you are at risk for hepatitis B infection. Hepatitis C Testing is recommended for:  Everyone born from 1945 through 1965.  Anyone with known risk factors for hepatitis C. Sexually transmitted infections (STIs)  Get screened for STIs, including gonorrhea and chlamydia, if: ? You are sexually active and are younger than 65 years of age. ? You are older than 65 years of age and your health care provider tells you that you are at risk for this type of infection. ? Your sexual activity has changed since you were last screened, and you are at increased risk for chlamydia or gonorrhea. Ask your health care provider if   you are at risk.  Ask your health care provider about whether you are at high risk for HIV. Your health care provider may recommend a prescription medicine to help prevent HIV infection. If you choose to take medicine to prevent HIV, you should first get tested for HIV. You should then be tested every 3 months for as long as you are taking the medicine. Pregnancy  If you are about to stop having your period (premenopausal) and  you may become pregnant, seek counseling before you get pregnant.  Take 400 to 800 micrograms (mcg) of folic acid every day if you become pregnant.  Ask for birth control (contraception) if you want to prevent pregnancy. Osteoporosis and menopause Osteoporosis is a disease in which the bones lose minerals and strength with aging. This can result in bone fractures. If you are 65 years old or older, or if you are at risk for osteoporosis and fractures, ask your health care provider if you should:  Be screened for bone loss.  Take a calcium or vitamin D supplement to lower your risk of fractures.  Be given hormone replacement therapy (HRT) to treat symptoms of menopause. Follow these instructions at home: Lifestyle  Do not use any products that contain nicotine or tobacco, such as cigarettes, e-cigarettes, and chewing tobacco. If you need help quitting, ask your health care provider.  Do not use street drugs.  Do not share needles.  Ask your health care provider for help if you need support or information about quitting drugs. Alcohol use  Do not drink alcohol if: ? Your health care provider tells you not to drink. ? You are pregnant, may be pregnant, or are planning to become pregnant.  If you drink alcohol: ? Limit how much you use to 0-1 drink a day. ? Limit intake if you are breastfeeding.  Be aware of how much alcohol is in your drink. In the [place] one drink equals one 12 oz bottle of beer (355 mL), one 5 oz glass of wine (148 mL), or one 1 oz glass of hard liquor (44 mL). General instructions  Schedule regular health, dental, and eye exams.  Stay current with your vaccines.  Tell your health care provider if: ? You often feel depressed. ? You have ever been abused or do not feel safe at home. Summary  Adopting a healthy lifestyle and getting preventive care are important in promoting health and wellness.  Follow your health care provider's instructions about healthy  diet, exercising, and getting tested or screened for diseases.  Follow your health care provider's instructions on monitoring your cholesterol and blood pressure. This information is not intended to replace advice given to you by your health care provider. Make sure you discuss any questions you have with your health care provider. Document Released: 08/29/2010 Document Revised: 02/06/2018 Document Reviewed: 02/06/2018 Elsevier Patient Education  2020 Elsevier Inc.  

## 2019-01-14 LAB — LIPID PANEL
Cholesterol: 217 mg/dL — ABNORMAL HIGH (ref <200)
HDL: 76 mg/dL (ref >=50)
LDL Cholesterol (Calc): 121 mg/dL (calc) — ABNORMAL HIGH
Non-HDL Cholesterol (Calc): 141 mg/dL (calc) — ABNORMAL HIGH (ref <130)
Total CHOL/HDL Ratio: 2.9 (calc) (ref <5.0)
Triglycerides: 92 mg/dL (ref <150)

## 2019-01-14 LAB — COMPLETE METABOLIC PANEL WITH GFR
AG Ratio: 1.9 (calc) (ref 1.0–2.5)
ALT: 15 U/L (ref 6–29)
AST: 18 U/L (ref 10–35)
Albumin: 4.5 g/dL (ref 3.6–5.1)
Alkaline phosphatase (APISO): 63 U/L (ref 37–153)
BUN: 9 mg/dL (ref 7–25)
CO2: 28 mmol/L (ref 20–32)
Calcium: 9.5 mg/dL (ref 8.6–10.4)
Chloride: 104 mmol/L (ref 98–110)
Creat: 0.92 mg/dL (ref 0.50–0.99)
GFR, Est African American: 76 mL/min/{1.73_m2} (ref >=60)
GFR, Est Non African American: 65 mL/min/{1.73_m2} (ref >=60)
Globulin: 2.4 g/dL (calc) (ref 1.9–3.7)
Glucose, Bld: 91 mg/dL (ref 65–99)
Potassium: 3.8 mmol/L (ref 3.5–5.3)
Sodium: 140 mmol/L (ref 135–146)
Total Bilirubin: 0.9 mg/dL (ref 0.2–1.2)
Total Protein: 6.9 g/dL (ref 6.1–8.1)

## 2019-02-06 ENCOUNTER — Other Ambulatory Visit: Payer: Self-pay

## 2019-02-06 ENCOUNTER — Ambulatory Visit (INDEPENDENT_AMBULATORY_CARE_PROVIDER_SITE_OTHER): Payer: Medicare Other

## 2019-02-06 DIAGNOSIS — Z1231 Encounter for screening mammogram for malignant neoplasm of breast: Secondary | ICD-10-CM

## 2019-04-21 ENCOUNTER — Other Ambulatory Visit: Payer: Self-pay | Admitting: *Deleted

## 2019-04-21 ENCOUNTER — Other Ambulatory Visit: Payer: Self-pay

## 2019-04-21 ENCOUNTER — Ambulatory Visit (INDEPENDENT_AMBULATORY_CARE_PROVIDER_SITE_OTHER): Payer: Medicare Other | Admitting: Sports Medicine

## 2019-04-21 ENCOUNTER — Ambulatory Visit (INDEPENDENT_AMBULATORY_CARE_PROVIDER_SITE_OTHER): Payer: Medicare Other

## 2019-04-21 DIAGNOSIS — M1712 Unilateral primary osteoarthritis, left knee: Secondary | ICD-10-CM | POA: Diagnosis not present

## 2019-04-21 DIAGNOSIS — M1711 Unilateral primary osteoarthritis, right knee: Secondary | ICD-10-CM | POA: Diagnosis not present

## 2019-04-21 DIAGNOSIS — Z780 Asymptomatic menopausal state: Secondary | ICD-10-CM

## 2019-04-21 DIAGNOSIS — M170 Bilateral primary osteoarthritis of knee: Secondary | ICD-10-CM

## 2019-04-21 NOTE — Assessment & Plan Note (Signed)
Last treated in 2018, now with increasing pain, swelling, aspiration and injection today. Repeat x-rays. Return to see me in 1 month.

## 2019-04-21 NOTE — Progress Notes (Signed)
    Procedures performed today:    Procedure: Real-time Ultrasound Guided aspiration/injection of left knee Device: Samsung HS60  Verbal informed consent obtained.  Time-out conducted.  Noted no overlying erythema, induration, or other signs of local infection.  Skin prepped in a sterile fashion.  Local anesthesia: Topical Ethyl chloride.  With sterile technique and under real time ultrasound guidance:  Using an 18-gauge needle I aspirated 31 cc of clear, straw-colored fluid, syringe switched and 1 cc Kenalog 40, 2 cc lidocaine, 2 cc bupivacaine injected easily Completed without difficulty  Pain immediately resolved suggesting accurate placement of the medication.  Advised to call if fevers/chills, erythema, induration, drainage, or persistent bleeding.  Images permanently stored and available for review in the ultrasound unit.  Impression: Technically successful ultrasound guided injection.  Independent interpretation of tests performed by another provider:   None.  Impression and Recommendations:    Primary osteoarthritis of left knee Last treated in 2018, now with increasing pain, swelling, aspiration and injection today. Repeat x-rays. Return to see me in 1 month.    ___________________________________________ Ihor Austin. Benjamin Stain, M.D., ABFM., CAQSM. Primary Care and Sports Medicine [facility]  Adjunct Instructor of Family Medicine  [employer]

## 2019-04-25 ENCOUNTER — Ambulatory Visit: Payer: Medicare Other

## 2019-04-30 ENCOUNTER — Other Ambulatory Visit: Payer: Self-pay

## 2019-04-30 ENCOUNTER — Ambulatory Visit (INDEPENDENT_AMBULATORY_CARE_PROVIDER_SITE_OTHER): Payer: Medicare Other

## 2019-04-30 DIAGNOSIS — Z780 Asymptomatic menopausal state: Secondary | ICD-10-CM

## 2019-04-30 DIAGNOSIS — M8589 Other specified disorders of bone density and structure, multiple sites: Secondary | ICD-10-CM | POA: Diagnosis not present

## 2019-05-19 ENCOUNTER — Other Ambulatory Visit: Payer: Self-pay

## 2019-05-19 ENCOUNTER — Encounter: Payer: Self-pay | Admitting: Sports Medicine

## 2019-05-19 ENCOUNTER — Ambulatory Visit (INDEPENDENT_AMBULATORY_CARE_PROVIDER_SITE_OTHER): Payer: Medicare Other | Admitting: Sports Medicine

## 2019-05-19 DIAGNOSIS — M1712 Unilateral primary osteoarthritis, left knee: Secondary | ICD-10-CM

## 2019-05-19 NOTE — Assessment & Plan Note (Signed)
Julia Perez returns, she is doing significantly better after her aspiration and injection at the last visit. Overall she feels as though she can live with it, she is going to strap the knee with a compressive dressing, add over-the-counter Tylenol or ibuprofen, we talked about viscosupplementation which would be the neck step if needed. Certainly if viscosupplementation fails we could consider PRP x3 leukocyte poor before considering arthroplasty.

## 2019-05-19 NOTE — Progress Notes (Signed)
    Procedures performed today:    None.  Independent interpretation of notes and tests performed by another provider:   None.  Impression and Recommendations:    Primary osteoarthritis of left knee Julia Perez returns, she is doing significantly better after her aspiration and injection at the last visit. Overall she feels as though she can live with it, she is going to strap the knee with a compressive dressing, add over-the-counter Tylenol or ibuprofen, we talked about viscosupplementation which would be the neck step if needed. Certainly if viscosupplementation fails we could consider PRP x3 leukocyte poor before considering arthroplasty.    ___________________________________________ Ihor Austin. Benjamin Stain, M.D., ABFM., CAQSM. Primary Care and Sports Medicine [facility]  Adjunct Instructor of Family Medicine  [employer]

## 2019-07-21 DIAGNOSIS — S46911A Strain of unspecified muscle, fascia and tendon at shoulder and upper arm level, right arm, initial encounter: Secondary | ICD-10-CM | POA: Diagnosis not present

## 2019-07-21 DIAGNOSIS — R9431 Abnormal electrocardiogram [ECG] [EKG]: Secondary | ICD-10-CM | POA: Diagnosis not present

## 2019-07-21 DIAGNOSIS — S46811A Strain of other muscles, fascia and tendons at shoulder and upper arm level, right arm, initial encounter: Secondary | ICD-10-CM | POA: Diagnosis not present

## 2019-07-21 DIAGNOSIS — Z882 Allergy status to sulfonamides status: Secondary | ICD-10-CM | POA: Diagnosis not present

## 2019-07-21 DIAGNOSIS — F419 Anxiety disorder, unspecified: Secondary | ICD-10-CM | POA: Diagnosis not present

## 2019-07-21 DIAGNOSIS — R42 Dizziness and giddiness: Secondary | ICD-10-CM | POA: Diagnosis not present

## 2019-07-21 DIAGNOSIS — M5412 Radiculopathy, cervical region: Secondary | ICD-10-CM | POA: Diagnosis not present

## 2019-08-07 ENCOUNTER — Telehealth: Payer: Self-pay

## 2019-08-07 NOTE — Telephone Encounter (Signed)
Agree with documentation as above.   Aiman Noe, MD  

## 2019-08-07 NOTE — Telephone Encounter (Signed)
Julia Perez reports irregular heart beat off and on for 2 weeks. She has been having the more frequently today. She has appointment tomorrow. I advised to keep her appointment and she could cancel later. Advised since she was already in the hospital with her mom have a pacemaker placed she should go down to the ED for evaluation.

## 2019-08-07 NOTE — Telephone Encounter (Signed)
Agree with plan and recommendation.

## 2019-08-08 ENCOUNTER — Ambulatory Visit: Payer: Medicare Other | Admitting: Medical-Surgical

## 2019-08-12 ENCOUNTER — Other Ambulatory Visit: Payer: Self-pay

## 2019-08-12 ENCOUNTER — Encounter: Payer: Self-pay | Admitting: Medical-Surgical

## 2019-08-12 ENCOUNTER — Ambulatory Visit (INDEPENDENT_AMBULATORY_CARE_PROVIDER_SITE_OTHER): Payer: Medicare Other | Admitting: Medical-Surgical

## 2019-08-12 VITALS — BP 127/75 | HR 81 | Temp 98.1°F | Ht 65.0 in | Wt 182.8 lb

## 2019-08-12 DIAGNOSIS — F419 Anxiety disorder, unspecified: Secondary | ICD-10-CM | POA: Diagnosis not present

## 2019-08-12 DIAGNOSIS — I499 Cardiac arrhythmia, unspecified: Secondary | ICD-10-CM

## 2019-08-12 NOTE — Progress Notes (Signed)
Subjective:    CC: irregular heartbeat  HPI: Very pleasant 64 year old female presenting today with reports of irregular heartbeat.  She reports she has long history of this she was in her 4s but it has increased over the last month.  Reports a sensation like her heart is skipping a beat that is accompanied occasionally by a brief second of lightheadedness that spontaneously resolves.  Endorses a brief pressure in the lower throat that happens occasionally but self resolves.  Denies chest pain, chest tightness, shortness of breath, arm/jaw pain, nausea, diaphoresis, and headaches.  Tolerates exercise okay, recently went on a 3 to 4 mile hike without difficulty.  Reports increased anxiety when she feels these feelings which tends to make the irregular heartbeat worse.  Anxiety/depression-reports she has had difficulty with anxiety for many many years.  He used to have panic attacks but these were well controlled after participating in therapy.  Feels that her depression/anxiety has not worsened lately as she has had many life changes and is concerned about the health of her husband as well as her mother.  Denies SI/HI.  Right arm numbness/tingling-was recently seen for right arm numbness and tingling and diagnosed with a pinched nerve.  Was prescribed Flexeril and prescription strength ibuprofen but she did not have these filled as she does not like taking medications.  She endorses some continued tingling along with the shoulder and the lateral deltoid that sometimes extends down the forearm to the wrist.  Numbness and tingling worse with certain movements and positions of the arm.  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  No recent injury or trauma to the area.  I reviewed the past medical history, family history, social history, surgical history, and allergies today and no changes were needed.  Please see the problem list section below in epic for further  details.  Past Medical History: History reviewed. No pertinent past medical history. Past Surgical History: Past Surgical History:  Procedure Laterality Date  . bladder tac  1988  . Endoscopic pregnancy  1998  . HERNIA REPAIR  1959  . VAGINAL HYSTERECTOMY  1998   Social History: Social History   Socioeconomic History  . Marital status: Married    Spouse name: Leory Plowman  . Number of children: 6  . Years of education: Not on file  . Highest education level: Not on file  Occupational History  . Occupation: hairstylist  Tobacco Use  . Smoking status: Never Smoker  . Smokeless tobacco: Never Used  Substance and Sexual Activity  . Alcohol use: No  . Drug use: No  . Sexual activity: Yes    Partners: Male  Other Topics Concern  . 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 She is married to [place] with 6 children. Her youngest daughter and third some of the home. 7 pregnancies and 6 deliveries with one miscarriage.   Social Determinants of Health   Financial Resource Strain:   . Difficulty of Paying Living Expenses:   Food Insecurity:   . Worried About Charity fundraiser in the Last Year:   . Arboriculturist in the Last Year:   Transportation Needs:   . Film/video editor (Medical):   Marland Kitchen Lack of Transportation (Non-Medical):   Physical Activity:   . Days of Exercise per Week:   . Minutes of Exercise per Session:   Stress:   . Feeling  of Stress :   Social Connections:   . Frequency of Communication with Friends and Family:   . Frequency of Social Gatherings with Friends and Family:   . Attends Religious Services:   . Active Member of Clubs or Organizations:   . Attends Banker Meetings:   Marland Kitchen Marital Status:    Family History: Family History  Problem Relation Age of Onset  . Breast cancer Mother 37       Postmenopausal  . Diabetes Mellitus II Mother   . Heart attack Father 31    Allergies: Allergies  Allergen Reactions  . Sulfa Antibiotics Rash and Itching   Medications: See med rec.  Review of Systems: No fevers, chills, night sweats, weight loss, chest pain, or shortness of breath.   Objective:    General: Well Developed, well nourished, and in no acute distress.  Neuro: Alert and oriented x3.  HEENT: Normocephalic, atraumatic.  Skin: Warm and dry. Cardiac: Regular rate and rhythm, no murmurs rubs or gallops, no lower extremity edema.  Respiratory: Clear to auscultation bilaterally. Not using accessory muscles, speaking in full sentences.   Impression and Recommendations:    1. Irregular heartbeat Checking CBC, CMP, and TSH today.  EKG on 07/21/2019 shows normal sinus rhythm.  We will plan to do a long-term Zio patch as this may give [place] a clear picture of any specific arrhythmia. - CBC - COMPLETE METABOLIC PANEL WITH GFR - LONG TERM MONITOR (3-14 DAYS) - TSH  2. Anxiety Patient reports she does not like taking medications but is open to the idea of resuming counseling.  Referring to behavioral health per her request. - Ambulatory referral to Behavioral Health  3.  Right arm numbness/tingling Agree that this is likely a pinched nerve resulting from cervical radiculopathy.  Advised patient to trial regular doses of ibuprofen.  Discussed trialing the muscle relaxer but advised to initiate this on a weekend day so that she can see how it affects her.  May continue to use conservative treatments including massage, heat, ice, and gentle stretching.  Return if symptoms worsen or fail to improve. ___________________________________________ Thayer Ohm, DNP, APRN, FNP-BC Primary Care and Sports Medicine [facility]

## 2019-08-13 LAB — CBC
HCT: 39 % (ref 35.0–45.0)
Hemoglobin: 13.1 g/dL (ref 11.7–15.5)
MCH: 30.1 pg (ref 27.0–33.0)
MCHC: 33.6 g/dL (ref 32.0–36.0)
MCV: 89.7 fL (ref 80.0–100.0)
MPV: 10 fL (ref 7.5–12.5)
Platelets: 249 10*3/uL (ref 140–400)
RBC: 4.35 10*6/uL (ref 3.80–5.10)
RDW: 12.1 % (ref 11.0–15.0)
WBC: 5.9 10*3/uL (ref 3.8–10.8)

## 2019-08-13 LAB — COMPLETE METABOLIC PANEL WITH GFR
AG Ratio: 2 (calc) (ref 1.0–2.5)
ALT: 15 U/L (ref 6–29)
AST: 18 U/L (ref 10–35)
Albumin: 4.4 g/dL (ref 3.6–5.1)
Alkaline phosphatase (APISO): 71 U/L (ref 37–153)
BUN: 15 mg/dL (ref 7–25)
CO2: 29 mmol/L (ref 20–32)
Calcium: 8.9 mg/dL (ref 8.6–10.4)
Chloride: 105 mmol/L (ref 98–110)
Creat: 0.81 mg/dL (ref 0.50–0.99)
GFR, Est African American: 88 mL/min/{1.73_m2} (ref >=60)
GFR, Est Non African American: 76 mL/min/{1.73_m2} (ref >=60)
Globulin: 2.2 g/dL (calc) (ref 1.9–3.7)
Glucose, Bld: 108 mg/dL — ABNORMAL HIGH (ref 65–99)
Potassium: 3.7 mmol/L (ref 3.5–5.3)
Sodium: 141 mmol/L (ref 135–146)
Total Bilirubin: 0.6 mg/dL (ref 0.2–1.2)
Total Protein: 6.6 g/dL (ref 6.1–8.1)

## 2019-08-13 LAB — TSH: TSH: 1.6 mIU/L (ref 0.40–4.50)

## 2019-08-14 ENCOUNTER — Telehealth: Payer: Self-pay | Admitting: Radiology

## 2019-08-14 NOTE — Telephone Encounter (Signed)
Enrolled patient for a 14 day Zio monitor to be mailed to patients home.  

## 2019-08-24 ENCOUNTER — Other Ambulatory Visit (INDEPENDENT_AMBULATORY_CARE_PROVIDER_SITE_OTHER): Payer: Medicare Other

## 2019-08-24 DIAGNOSIS — I499 Cardiac arrhythmia, unspecified: Secondary | ICD-10-CM | POA: Diagnosis not present

## 2019-08-24 DIAGNOSIS — R002 Palpitations: Secondary | ICD-10-CM

## 2019-09-08 ENCOUNTER — Ambulatory Visit (INDEPENDENT_AMBULATORY_CARE_PROVIDER_SITE_OTHER): Payer: Medicare Other | Admitting: Psychologist

## 2019-09-08 DIAGNOSIS — F320 Major depressive disorder, single episode, mild: Secondary | ICD-10-CM | POA: Diagnosis not present

## 2019-09-08 DIAGNOSIS — F411 Generalized anxiety disorder: Secondary | ICD-10-CM

## 2019-09-12 ENCOUNTER — Other Ambulatory Visit: Payer: Self-pay

## 2019-09-12 ENCOUNTER — Ambulatory Visit (INDEPENDENT_AMBULATORY_CARE_PROVIDER_SITE_OTHER): Payer: Medicare Other | Admitting: Sports Medicine

## 2019-09-12 DIAGNOSIS — M5412 Radiculopathy, cervical region: Secondary | ICD-10-CM

## 2019-09-12 MED ORDER — PREDNISONE 50 MG PO TABS: ORAL_TABLET | ORAL | 0 refills | Status: DC

## 2019-09-12 NOTE — Assessment & Plan Note (Signed)
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 Numbness and tingling, no progressive weakness, no trauma, no constitutional symptoms. We will start conservatively with 5 days of prednisone, she should take her Flexeril at night, adding physical therapy, she did have a CT of the cervical spine at an outside facility, she will get this burned to a disc for [place]. We have also discussed improving ergonomics at work. Return to see me in 6 weeks.

## 2019-09-12 NOTE — Progress Notes (Signed)
    Procedures performed today:    None.  Independent interpretation of notes and tests performed by another provider:   None.  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 Numbness and tingling, no progressive weakness, no trauma, no constitutional symptoms. We will start conservatively with 5 days of prednisone, she should take her Flexeril at night, adding physical therapy, she did have a CT of the cervical spine at an outside facility, she will get this burned to a disc for [place]. We have also discussed improving ergonomics at work. Return to see me in 6 weeks.    ___________________________________________ Ihor Austin. Benjamin Stain, M.D., ABFM., CAQSM. Primary Care and Sports Medicine [facility]  Adjunct Instructor of Family Medicine  [employer]

## 2019-09-15 ENCOUNTER — Ambulatory Visit: Payer: Medicare Other | Admitting: Rehabilitative and Restorative Service Providers"

## 2019-09-15 ENCOUNTER — Other Ambulatory Visit: Payer: Self-pay

## 2019-09-15 DIAGNOSIS — I499 Cardiac arrhythmia, unspecified: Secondary | ICD-10-CM

## 2019-09-18 DIAGNOSIS — M25511 Pain in right shoulder: Secondary | ICD-10-CM | POA: Diagnosis not present

## 2019-09-18 DIAGNOSIS — M5412 Radiculopathy, cervical region: Secondary | ICD-10-CM | POA: Diagnosis not present

## 2019-09-18 DIAGNOSIS — M542 Cervicalgia: Secondary | ICD-10-CM | POA: Diagnosis not present

## 2019-09-23 DIAGNOSIS — R002 Palpitations: Secondary | ICD-10-CM | POA: Diagnosis not present

## 2019-09-23 DIAGNOSIS — M25511 Pain in right shoulder: Secondary | ICD-10-CM | POA: Diagnosis not present

## 2019-09-23 DIAGNOSIS — M5412 Radiculopathy, cervical region: Secondary | ICD-10-CM | POA: Diagnosis not present

## 2019-09-23 DIAGNOSIS — M542 Cervicalgia: Secondary | ICD-10-CM | POA: Diagnosis not present

## 2019-09-24 ENCOUNTER — Other Ambulatory Visit: Payer: Self-pay | Admitting: Medical-Surgical

## 2019-09-24 DIAGNOSIS — R002 Palpitations: Secondary | ICD-10-CM

## 2019-09-24 DIAGNOSIS — I499 Cardiac arrhythmia, unspecified: Secondary | ICD-10-CM

## 2019-09-26 ENCOUNTER — Ambulatory Visit (INDEPENDENT_AMBULATORY_CARE_PROVIDER_SITE_OTHER): Payer: Medicare Other | Admitting: Psychologist

## 2019-09-26 DIAGNOSIS — M542 Cervicalgia: Secondary | ICD-10-CM | POA: Diagnosis not present

## 2019-09-26 DIAGNOSIS — F320 Major depressive disorder, single episode, mild: Secondary | ICD-10-CM | POA: Diagnosis not present

## 2019-09-26 DIAGNOSIS — M5412 Radiculopathy, cervical region: Secondary | ICD-10-CM | POA: Diagnosis not present

## 2019-09-26 DIAGNOSIS — F411 Generalized anxiety disorder: Secondary | ICD-10-CM | POA: Diagnosis not present

## 2019-09-26 DIAGNOSIS — M25511 Pain in right shoulder: Secondary | ICD-10-CM | POA: Diagnosis not present

## 2019-09-29 ENCOUNTER — Telehealth: Payer: Self-pay

## 2019-09-29 DIAGNOSIS — M25511 Pain in right shoulder: Secondary | ICD-10-CM | POA: Diagnosis not present

## 2019-09-29 DIAGNOSIS — M542 Cervicalgia: Secondary | ICD-10-CM | POA: Diagnosis not present

## 2019-09-29 DIAGNOSIS — M5412 Radiculopathy, cervical region: Secondary | ICD-10-CM | POA: Diagnosis not present

## 2019-09-29 NOTE — Telephone Encounter (Signed)
I forwarded the results to Dr. Linford Arnold for review as she is the patient's PCP.

## 2019-09-29 NOTE — Telephone Encounter (Signed)
Julia Perez is calling for the results of the heart monitor. Please advise.

## 2019-09-30 NOTE — Telephone Encounter (Signed)
See result note.  

## 2019-10-01 ENCOUNTER — Encounter: Payer: Self-pay | Admitting: Family Medicine

## 2019-10-01 ENCOUNTER — Telehealth (INDEPENDENT_AMBULATORY_CARE_PROVIDER_SITE_OTHER): Payer: Medicare Other | Admitting: Family Medicine

## 2019-10-01 DIAGNOSIS — I471 Supraventricular tachycardia: Secondary | ICD-10-CM | POA: Diagnosis not present

## 2019-10-01 DIAGNOSIS — M5412 Radiculopathy, cervical region: Secondary | ICD-10-CM | POA: Diagnosis not present

## 2019-10-01 DIAGNOSIS — M542 Cervicalgia: Secondary | ICD-10-CM | POA: Diagnosis not present

## 2019-10-01 DIAGNOSIS — M25511 Pain in right shoulder: Secondary | ICD-10-CM | POA: Diagnosis not present

## 2019-10-01 NOTE — Progress Notes (Signed)
Virtual Visit via Video Note  I connected with Julia Perez on 10/01/19 at  7:50 AM EDT by a video enabled telemedicine application and verified that I am speaking with the correct person using two identifiers.   I discussed the limitations of evaluation and management by telemedicine and the availability of in person appointments. The patient expressed understanding and agreed to proceed.  Patient location: at home Provider location: in office  Subjective:    CC: Review heart monitor results.  HPI: Julia Perez is a 64 year old female with no prior history of cardiac disease who reports that she has been having palpitations on and off since she was probably in her 63s.  She says in fact she had some form of cardiac work-up a few years ago that was essentially normal.  But more recently she been having more frequent palpitations and was actually feeling a little bit dizzy with them they usually would only last for a few seconds.  She never really had dizziness with episodes before.  She is also been under an enormous amount of stress recently and felt like that was probably contributing but did have a visit with one of my partners which resulted in ordering the cardiac monitor.   Past medical history, Surgical history, Family history not pertinant except as noted below, Social history, Allergies, and medications have been entered into the medical record, reviewed, and corrections made.   Review of Systems: No fevers, chills, night sweats, weight loss, chest pain, or shortness of breath.   Objective:    General: Speaking clearly in complete sentences without any shortness of breath.  Alert and oriented x3.  Normal judgment. No apparent acute distress.    Impression and Recommendations:    Paroxysmal supraventricular tachycardia (HCC) Reviewed heart monitor results with her today.  Discussed the importance of getting her in with cardiology she has been having brief episodes of SVT.  The  longest lasted about 20 seconds but most of them lasting just a couple of seconds.  No prior history of cardiac disease she does have a family history of sudden cardiac death.  We will try to get her in within the next 5-7 business days.  Did discuss with her that if she feels she starts having 1 of these palpitation episodes that continues and does not resolve after few quick seconds then recommend that she go to the emergency department for immediate care.      Time spent in encounter 21 minutes  I discussed the assessment and treatment plan with the patient. The patient was provided an opportunity to ask questions and all were answered. The patient agreed with the plan and demonstrated an understanding of the instructions.   The patient was advised to call back or seek an in-person evaluation if the symptoms worsen or if the condition fails to improve as anticipated.   Nani Gasser, MD

## 2019-10-01 NOTE — Assessment & Plan Note (Signed)
Reviewed heart monitor results with her today.  Discussed the importance of getting her in with cardiology she has been having brief episodes of SVT.  The longest lasted about 20 seconds but most of them lasting just a couple of seconds.  No prior history of cardiac disease she does have a family history of sudden cardiac death.  We will try to get her in within the next 5-7 business days.  Did discuss with her that if she feels she starts having 1 of these palpitation episodes that continues and does not resolve after few quick seconds then recommend that she go to the emergency department for immediate care.

## 2019-10-06 DIAGNOSIS — M25511 Pain in right shoulder: Secondary | ICD-10-CM | POA: Diagnosis not present

## 2019-10-06 DIAGNOSIS — M542 Cervicalgia: Secondary | ICD-10-CM | POA: Diagnosis not present

## 2019-10-06 DIAGNOSIS — M5412 Radiculopathy, cervical region: Secondary | ICD-10-CM | POA: Diagnosis not present

## 2019-10-07 DIAGNOSIS — I471 Supraventricular tachycardia: Secondary | ICD-10-CM | POA: Diagnosis not present

## 2019-10-07 DIAGNOSIS — R002 Palpitations: Secondary | ICD-10-CM | POA: Diagnosis not present

## 2019-10-07 DIAGNOSIS — R9431 Abnormal electrocardiogram [ECG] [EKG]: Secondary | ICD-10-CM | POA: Diagnosis not present

## 2019-10-09 DIAGNOSIS — M542 Cervicalgia: Secondary | ICD-10-CM | POA: Diagnosis not present

## 2019-10-09 DIAGNOSIS — M25511 Pain in right shoulder: Secondary | ICD-10-CM | POA: Diagnosis not present

## 2019-10-09 DIAGNOSIS — M5412 Radiculopathy, cervical region: Secondary | ICD-10-CM | POA: Diagnosis not present

## 2019-10-10 ENCOUNTER — Ambulatory Visit (INDEPENDENT_AMBULATORY_CARE_PROVIDER_SITE_OTHER): Payer: Medicare Other | Admitting: Psychologist

## 2019-10-10 DIAGNOSIS — F320 Major depressive disorder, single episode, mild: Secondary | ICD-10-CM | POA: Diagnosis not present

## 2019-10-10 DIAGNOSIS — F411 Generalized anxiety disorder: Secondary | ICD-10-CM | POA: Diagnosis not present

## 2019-10-13 DIAGNOSIS — M25511 Pain in right shoulder: Secondary | ICD-10-CM | POA: Diagnosis not present

## 2019-10-13 DIAGNOSIS — M542 Cervicalgia: Secondary | ICD-10-CM | POA: Diagnosis not present

## 2019-10-13 DIAGNOSIS — M5412 Radiculopathy, cervical region: Secondary | ICD-10-CM | POA: Diagnosis not present

## 2019-10-15 DIAGNOSIS — M25511 Pain in right shoulder: Secondary | ICD-10-CM | POA: Diagnosis not present

## 2019-10-15 DIAGNOSIS — M5412 Radiculopathy, cervical region: Secondary | ICD-10-CM | POA: Diagnosis not present

## 2019-10-15 DIAGNOSIS — M542 Cervicalgia: Secondary | ICD-10-CM | POA: Diagnosis not present

## 2019-10-21 DIAGNOSIS — M5412 Radiculopathy, cervical region: Secondary | ICD-10-CM | POA: Diagnosis not present

## 2019-10-21 DIAGNOSIS — M25511 Pain in right shoulder: Secondary | ICD-10-CM | POA: Diagnosis not present

## 2019-10-21 DIAGNOSIS — M542 Cervicalgia: Secondary | ICD-10-CM | POA: Diagnosis not present

## 2019-10-23 ENCOUNTER — Telehealth: Payer: Self-pay | Admitting: Family Medicine

## 2019-10-23 ENCOUNTER — Ambulatory Visit (INDEPENDENT_AMBULATORY_CARE_PROVIDER_SITE_OTHER): Payer: Medicare Other | Admitting: Sports Medicine

## 2019-10-23 DIAGNOSIS — M5412 Radiculopathy, cervical region: Secondary | ICD-10-CM

## 2019-10-23 NOTE — Progress Notes (Signed)
    Procedures performed today:    None.  Independent interpretation of notes and tests performed by another provider:   None.  Brief History, Exam, Impression, and Recommendations:    Radiculitis of right cervical region Zadia is a pleasant 64 year old female, she has right cervical radiculitis, she responded very well to prednisone and aggressive physical therapy with dry needling though her insurance denied the dry needling, she is going to ask them to appeal this decision. If unable I am happy to try to do the appeal myself. She will continue the home rehab exercises long-term. Return to see me as needed. Of note she did have a bit of epigastric discomfort with the prednisone some next time she needs prednisone she should likely just be placed on a PPI or H2 blocker at the same time.    ___________________________________________ Ihor Austin. Benjamin Stain, M.D., ABFM., CAQSM. Primary Care and Sports Medicine [facility]  Adjunct Instructor of Family Medicine  [employer]

## 2019-10-23 NOTE — Telephone Encounter (Signed)
Error

## 2019-10-23 NOTE — Assessment & Plan Note (Signed)
Julia Perez is a pleasant 64 year old female, she has right cervical radiculitis, she responded very well to prednisone and aggressive physical therapy with dry needling though her insurance denied the dry needling, she is going to ask them to appeal this decision. If unable I am happy to try to do the appeal myself. She will continue the home rehab exercises long-term. Return to see me as needed. Of note she did have a bit of epigastric discomfort with the prednisone some next time she needs prednisone she should likely just be placed on a PPI or H2 blocker at the same time.

## 2019-10-24 ENCOUNTER — Ambulatory Visit: Payer: Medicare Other | Admitting: Sports Medicine

## 2019-11-05 DIAGNOSIS — Z0120 Encounter for dental examination and cleaning without abnormal findings: Secondary | ICD-10-CM | POA: Diagnosis not present

## 2019-11-06 DIAGNOSIS — I471 Supraventricular tachycardia: Secondary | ICD-10-CM | POA: Diagnosis not present

## 2019-11-06 DIAGNOSIS — R002 Palpitations: Secondary | ICD-10-CM | POA: Diagnosis not present

## 2019-11-12 DIAGNOSIS — D225 Melanocytic nevi of trunk: Secondary | ICD-10-CM | POA: Diagnosis not present

## 2019-11-12 DIAGNOSIS — L720 Epidermal cyst: Secondary | ICD-10-CM | POA: Diagnosis not present

## 2019-11-12 DIAGNOSIS — L821 Other seborrheic keratosis: Secondary | ICD-10-CM | POA: Diagnosis not present

## 2019-11-12 DIAGNOSIS — L820 Inflamed seborrheic keratosis: Secondary | ICD-10-CM | POA: Diagnosis not present

## 2020-01-12 DIAGNOSIS — E669 Obesity, unspecified: Secondary | ICD-10-CM | POA: Diagnosis not present

## 2020-01-12 DIAGNOSIS — R0683 Snoring: Secondary | ICD-10-CM | POA: Diagnosis not present

## 2020-01-12 DIAGNOSIS — Z23 Encounter for immunization: Secondary | ICD-10-CM | POA: Diagnosis not present

## 2020-01-12 DIAGNOSIS — G4710 Hypersomnia, unspecified: Secondary | ICD-10-CM | POA: Diagnosis not present

## 2020-01-19 ENCOUNTER — Other Ambulatory Visit: Payer: Self-pay | Admitting: Family Medicine

## 2020-01-19 ENCOUNTER — Encounter: Payer: Self-pay | Admitting: Medical-Surgical

## 2020-01-19 ENCOUNTER — Other Ambulatory Visit: Payer: Self-pay

## 2020-01-19 ENCOUNTER — Ambulatory Visit (INDEPENDENT_AMBULATORY_CARE_PROVIDER_SITE_OTHER): Payer: Medicare Other | Admitting: Medical-Surgical

## 2020-01-19 VITALS — BP 114/73 | HR 74 | Temp 98.1°F | Ht 65.0 in | Wt 189.2 lb

## 2020-01-19 DIAGNOSIS — Z1231 Encounter for screening mammogram for malignant neoplasm of breast: Secondary | ICD-10-CM

## 2020-01-19 DIAGNOSIS — Z0000 Encounter for general adult medical examination without abnormal findings: Secondary | ICD-10-CM

## 2020-01-19 NOTE — Patient Instructions (Signed)
°  Ms. Lal , Thank you for taking time to come for your Medicare Wellness Visit. I appreciate your ongoing commitment to your health goals. Please review the following plan we discussed and let me know if I can assist you in the future.   These are the goals we discussed: Goals     Exercise 3x per week (30 min per time)     Get into an exercise program to be more active.       This is a list of the screening recommended for you and due dates:  Health Maintenance  Topic Date Due   Pneumonia vaccines (2 of 2 - PPSV23) 01/13/2020   Mammogram  02/05/2021   Colon Cancer Screening  07/29/2023   Tetanus Vaccine  01/08/2028   Flu Shot  Completed   DEXA scan (bone density measurement)  Completed   COVID-19 Vaccine  Completed    Hepatitis C: One time screening is recommended by Center for Disease Control  (CDC) for  adults born from [birth year] through 1967.   Completed

## 2020-01-19 NOTE — Progress Notes (Signed)
Subjective:   ASHAYA RAFTERY is a 64 y.o. female who presents for an Initial Medicare Annual Wellness Visit.  Review of Systems    No fever, chills, fatigue, weight changes, shortness of breath, chest pain, GI symptoms, headaches, dizziness, sleep disturbances, depression, or SI/HI.   Upcoming sleep study.   Cardiac Risk Factors include: advanced age (>43men, >41 women)    Objective:    Today's Vitals   01/19/20 1021  BP: 114/73  Pulse: 74  Temp: 98.1 F (36.7 C)  TempSrc: Oral  SpO2: 97%  Weight: 189 lb 3.2 oz (85.8 kg)  Height: 5\' 5"  (1.651 m)   Body mass index is 31.48 kg/m.  Advanced Directives 01/19/2020 01/13/2019 01/26/2016  Does Patient Have a Medical Advance Directive? Yes Yes Yes  Type of 01/28/2016 of [place];Living will;Out of facility DNR (pink MOST or yellow form) Healthcare Power of [place];Living will -  Does patient want to make changes to medical advance directive? No - Patient declined No - Patient declined No - Patient declined  Copy of Healthcare Power of Attorney in Chart? No - copy requested No - copy requested -    Current Medications (verified) Outpatient Encounter Medications as of 01/19/2020  Medication Sig  . metoprolol tartrate (LOPRESSOR) 25 MG tablet Take 0.5 tablets by mouth daily.   No facility-administered encounter medications on file as of 01/19/2020.    Allergies (verified) Sulfa antibiotics   History: Past Medical History:  Diagnosis Date  . Palpitations    Past Surgical History:  Procedure Laterality Date  . bladder tac  1988  . Endoscopic pregnancy  1998  . HERNIA REPAIR  1959  . VAGINAL HYSTERECTOMY  1998   Family History  Problem Relation Age of Onset  . Breast cancer Mother 73       Postmenopausal  . Diabetes Mellitus II Mother   . Heart attack Father 66   Social History   Socioeconomic History  . Marital status: Married    Spouse name: 41  . Number of children: 6  .  Years of education: Not on file  . Highest education level: Not on file  Occupational History  . Occupation: hairstylist  Tobacco Use  . Smoking status: Never Smoker  . Smokeless tobacco: Never Used  Vaping Use  . Vaping Use: Never used  Substance and Sexual Activity  . Alcohol use: No  . Drug use: No  . Sexual activity: Not Currently    Partners: Male  Other Topics Concern  . 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 She has a [name]. She is married to [place] with 6 children. Her youngest daughter and third some of the home. 7 pregnancies and 6 deliveries with one miscarriage.   Social Determinants of Health   Financial Resource Strain:   . Difficulty of Paying Living Expenses: Not on file  Food Insecurity:   . Worried About [place] in the Last Year: Not on file  . Ran Out of Food in the Last Year: Not on file  Transportation Needs:   . Lack of Transportation (Medical): Not on file  . Lack of Transportation (Non-Medical): Not on file  Physical Activity:   . Days of Exercise per Week: Not on file  . Minutes of Exercise per Session: Not on file  Stress:   . Feeling of Stress : Not on file  Social Connections:   . Frequency of Communication with Friends and  Family: Not on file  . Frequency of Social Gatherings with Friends and Family: Not on file  . Attends Religious Services: Not on file  . Active Member of Clubs or Organizations: Not on file  . Attends Banker Meetings: Not on file  . Marital Status: Not on file    Tobacco Counseling Counseling given: Not Answered   Clinical Intake:     Pain : No/denies pain     Nutritional Status: BMI > 30  Obese Nutritional Risks: None Diabetes: No  How often do you need to have someone help you when you read instructions, pamphlets, or other written materials from your doctor or pharmacy?: 1 - Never  Diabetic? No  Interpreter Needed?: No       Activities of Daily Living In your present state of health, do you have any difficulty performing the following activities: 01/19/2020  Hearing? N  Vision? N  Difficulty concentrating or making decisions? N  Walking or climbing stairs? N  Dressing or bathing? N  Doing errands, shopping? N  Preparing Food and eating ? N  Using the Toilet? N  In the past six months, have you accidently leaked urine? N  Do you have problems with loss of bowel control? N  Managing your Medications? N  Managing your Finances? N  Housekeeping or managing your Housekeeping? N  Some recent data might be hidden    Patient Care Team: Agapito Games, MD as PCP - General (Family Medicine)  Indicate any recent Medical Services you may have received from other than [facility] providers in the past year (date may be approximate).     Assessment:   This is a routine wellness examination for [place].  Hearing/Vision screen No exam data present  Dietary issues and exercise activities discussed: Current Exercise Habits: The patient does not participate in regular exercise at present, Exercise limited by: None identified  Goals    . Exercise 3x per week (30 min per time)     Get into an exercise program to be more active.      Depression Screen PHQ 2/9 Scores 01/19/2020 08/12/2019 01/13/2019 11/01/2018 01/04/2017  PHQ - 2 Score 0 2 1 0 0  PHQ- 9 Score - 3 - - -    Fall Risk Fall Risk  01/19/2020 01/13/2019 01/13/2019 11/01/2018 03/21/2018  Falls in the past year? 0 1 0 0 1  Number falls in past yr: 0 0 0 0 0  Injury with Fall? 0 0 0 0 1  Follow up Falls evaluation completed Falls prevention discussed - - -    Any stairs in or around the home? Yes  If so, are there any without handrails? Yes  Home free of loose throw rugs in walkways, pet beds, electrical cords, etc? Yes, throw rugs Adequate lighting in your home to reduce risk of falls? Yes   ASSISTIVE DEVICES UTILIZED TO PREVENT FALLS:  Life  alert? No  Use of a cane, walker or w/c? No  Grab bars in the bathroom? No  Shower chair or bench in shower? Yes  Elevated toilet seat or a handicapped toilet? Yes   Gait steady and fast without use of assistive device  Cognitive Function:     6CIT Screen 01/19/2020 01/13/2019  What Year? 0 points 0 points  What month? 0 points 0 points  What time? 0 points 0 points  Count back from 20 0 points 0 points  Months in reverse 0 points 0 points  Repeat phrase 0  points 0 points  Total Score 0 0    Immunizations Immunization History  Administered Date(s) Administered  . Influenza Split 01/12/2020  . Influenza,inj,Quad PF,6+ Mos 01/04/2017, 01/07/2018, 11/01/2018  . Influenza-Unspecified 12/15/2015  . Moderna SARS-COVID-2 Vaccination 05/21/2019, 06/18/2019, 01/12/2020  . Pneumococcal Conjugate-13 01/13/2019  . Tdap 03/02/2008, 01/07/2018  . Zoster 01/05/2016  . Zoster Recombinat (Shingrix) 01/04/2017, 06/28/2017    TDAP status: Up to date Flu Vaccine status: Up to date Pneumococcal vaccine status: Declined,  Education has been provided regarding the importance of this vaccine but patient still declined. Advised may receive this vaccine at local pharmacy or Health Dept. Aware to provide a copy of the vaccination record if obtained from local pharmacy or Health Dept. Verbalized acceptance and understanding.  Covid-19 vaccine status: Completed vaccines  Qualifies for Shingles Vaccine? Yes   Zostavax completed Yes   Shingrix Completed?: Yes  Screening Tests Health Maintenance  Topic Date Due  . PNA vac Low Risk Adult (2 of 2 - PPSV23) 01/13/2020  . MAMMOGRAM  02/05/2021  . COLONOSCOPY  07/29/2023  . TETANUS/TDAP  01/08/2028  . INFLUENZA VACCINE  Completed  . DEXA SCAN  Completed  . COVID-19 Vaccine  Completed  . Hepatitis C Screening  Completed    Health Maintenance  Health Maintenance Due  Topic Date Due  . PNA vac Low Risk Adult (2 of 2 - PPSV23) 01/13/2020     Colorectal cancer screening: Completed 2015. Repeat every 10 years Mammogram status: Completed 2020. Repeat every year Bone Density status: Completed 2021. Results reflect: Bone density results: OSTEOPENIA. Repeat every 2 years.  Lung Cancer Screening: (Low Dose CT Chest recommended if Age 73-80 years, 30 pack-year currently smoking OR have quit w/in 15years.) does not qualify.   Lung Cancer Screening Referral: NA  Additional Screening:  Hepatitis C Screening: does not qualify; Completed 2017  Vision Screening: Recommended annual ophthalmology exams for early detection of glaucoma and other disorders of the eye. Is the patient up to date with their annual eye exam?  Yes  Who is the provider or what is the name of the office in which the patient attends annual eye exams? Dr. Heidi DachAmy Wall If pt is not established with a provider, would they like to be referred to a provider to establish care? No .   Dental Screening: Recommended annual dental exams for proper oral hygiene  Community Resource Referral / Chronic Care Management: CRR required this visit?  No   CCM required this visit?  No      Plan:     Ms. Theophilus BonesHerbst , Thank you for taking time to come for your Medicare Wellness Visit. I appreciate your ongoing commitment to your health goals. Please review the following plan we discussed and let me know if I can assist you in the future.   These are the goals we discussed: Goals    . Exercise 3x per week (30 min per time)     Get into an exercise program to be more active.       This is a list of the screening recommended for you and due dates:  Health Maintenance  Topic Date Due  . Pneumonia vaccines (2 of 2 - PPSV23) 01/13/2020  . Mammogram  02/05/2021  . Colon Cancer Screening  07/29/2023  . Tetanus Vaccine  01/08/2028  . Flu Shot  Completed  . DEXA scan (bone density measurement)  Completed  . COVID-19 Vaccine  Completed  .  Hepatitis C: One time screening is  recommended by Center for Disease Control  (CDC) for  adults born from [birth year] through 1967.   Completed    I have personally reviewed and noted the following in the patient's chart:   . Medical and social history . Use of alcohol, tobacco or illicit drugs  . Current medications and supplements . Functional ability and status . Nutritional status . Physical activity . Advanced directives . List of other physicians . Hospitalizations, surgeries, and ER visits in previous 12 months . Vitals . Screenings to include cognitive, depression, and falls . Referrals and appointments  In addition, I have reviewed and discussed with patient certain preventive protocols, quality metrics, and best practice recommendations. A written personalized care plan for preventive services as well as general preventive health recommendations were provided to patient.   Thayer Ohm, DNP, APRN, FNP-BC [facility] and [facility]

## 2020-01-26 ENCOUNTER — Other Ambulatory Visit: Payer: Self-pay

## 2020-01-26 ENCOUNTER — Ambulatory Visit (INDEPENDENT_AMBULATORY_CARE_PROVIDER_SITE_OTHER): Payer: Medicare Other | Admitting: Family Medicine

## 2020-01-26 ENCOUNTER — Encounter: Payer: Self-pay | Admitting: Family Medicine

## 2020-01-26 VITALS — BP 126/70 | HR 66 | Temp 97.9°F | Ht 65.0 in | Wt 187.0 lb

## 2020-01-26 DIAGNOSIS — E785 Hyperlipidemia, unspecified: Secondary | ICD-10-CM | POA: Diagnosis not present

## 2020-01-26 DIAGNOSIS — I499 Cardiac arrhythmia, unspecified: Secondary | ICD-10-CM

## 2020-01-26 DIAGNOSIS — Z23 Encounter for immunization: Secondary | ICD-10-CM

## 2020-01-26 DIAGNOSIS — Z0000 Encounter for general adult medical examination without abnormal findings: Secondary | ICD-10-CM

## 2020-01-26 LAB — COMPLETE METABOLIC PANEL WITH GFR
AG Ratio: 1.9 (calc) (ref 1.0–2.5)
ALT: 17 U/L (ref 6–29)
AST: 15 U/L (ref 10–35)
Albumin: 4.5 g/dL (ref 3.6–5.1)
Alkaline phosphatase (APISO): 70 U/L (ref 37–153)
BUN: 12 mg/dL (ref 7–25)
CO2: 27 mmol/L (ref 20–32)
Calcium: 9.3 mg/dL (ref 8.6–10.4)
Chloride: 104 mmol/L (ref 98–110)
Creat: 0.92 mg/dL (ref 0.50–0.99)
GFR, Est African American: 75 mL/min/{1.73_m2} (ref >=60)
GFR, Est Non African American: 65 mL/min/{1.73_m2} (ref >=60)
Globulin: 2.4 g/dL (calc) (ref 1.9–3.7)
Glucose, Bld: 92 mg/dL (ref 65–99)
Potassium: 3.9 mmol/L (ref 3.5–5.3)
Sodium: 140 mmol/L (ref 135–146)
Total Bilirubin: 0.8 mg/dL (ref 0.2–1.2)
Total Protein: 6.9 g/dL (ref 6.1–8.1)

## 2020-01-26 LAB — LIPID PANEL W/REFLEX DIRECT LDL
Cholesterol: 238 mg/dL — ABNORMAL HIGH (ref <200)
HDL: 75 mg/dL (ref >=50)
LDL Cholesterol (Calc): 138 mg/dL (calc) — ABNORMAL HIGH
Non-HDL Cholesterol (Calc): 163 mg/dL (calc) — ABNORMAL HIGH (ref <130)
Total CHOL/HDL Ratio: 3.2 (calc) (ref <5.0)
Triglycerides: 127 mg/dL (ref <150)

## 2020-01-26 NOTE — Progress Notes (Signed)
Subjective:     Julia Perez is a 64 y.o. female and is here for a comprehensive physical exam. The patient reports no problems. She is doing well.  She has been doing a lot of traveling recently and working part-time as a hairdresser she says is been great to visit family recently..  Social History   Socioeconomic History  . Marital status: Married    Spouse name: [name]  . Number of children: 6  . Years of education: Not on file  . Highest education level: Not on file  Occupational History  . Occupation: hairstylist  Tobacco Use  . Smoking status: Never Smoker  . Smokeless tobacco: Never Used  Vaping Use  . Vaping Use: Never used  Substance and Sexual Activity  . Alcohol use: No  . Drug use: No  . Sexual activity: Not Currently    Partners: Male  Other Topics Concern  . 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 She is married to [place] with 6 children. Her youngest daughter and third some of the home. 7 pregnancies and 6 deliveries with one miscarriage.   Social Determinants of Health   Financial Resource Strain:   . Difficulty of Paying Living Expenses: Not on file  Food Insecurity:   . Worried About Programme researcher, broadcasting/film/video in the Last Year: Not on file  . Ran Out of Food in the Last Year: Not on file  Transportation Needs:   . Lack of Transportation (Medical): Not on file  . Lack of Transportation (Non-Medical): Not on file  Physical Activity:   . Days of Exercise per Week: Not on file  . Minutes of Exercise per Session: Not on file  Stress:   . Feeling of Stress : Not on file  Social Connections:   . Frequency of Communication with Friends and Family: Not on file  . Frequency of Social Gatherings with Friends and Family: Not on file  . Attends Religious Services: Not on file  . Active Member of Clubs or Organizations: Not on file  . Attends Banker Meetings: Not on file  .  Marital Status: Not on file  Intimate Partner Violence:   . Fear of Current or Ex-Partner: Not on file  . Emotionally Abused: Not on file  . Physically Abused: Not on file  . Sexually Abused: Not on file   Health Maintenance  Topic Date Due  . PNA vac Low Risk Adult (2 of 2 - PPSV23) 01/13/2020  . MAMMOGRAM  02/05/2021  . COLONOSCOPY  07/29/2023  . TETANUS/TDAP  01/08/2028  . INFLUENZA VACCINE  Completed  . DEXA SCAN  Completed  . COVID-19 Vaccine  Completed  . Hepatitis C Screening  Completed    The following portions of the patient's history were reviewed and updated as appropriate: allergies, current medications, past family history, past medical history, past social history, past surgical history and problem list.  Review of Systems A comprehensive review of systems was negative.   Objective:    BP 126/70   Pulse 66   Temp 97.9 F (36.6 C) (Oral)   Ht 5\' 5"  (1.651 m)   Wt 187 lb (84.8 kg)   SpO2 100% Comment: on RA  BMI 31.12 kg/m  General appearance: alert, cooperative and appears stated age Head: Normocephalic, without obvious abnormality, atraumatic Eyes: conj clear, EOMI, PEERLA Ears: normal TM's and external ear canals both ears Nose: Nares normal. Septum midline.  Mucosa normal. No drainage or sinus tenderness. Throat: lips, mucosa, and tongue normal; teeth and gums normal Neck: no adenopathy, no carotid bruit, no JVD, supple, symmetrical, trachea midline and thyroid not enlarged, symmetric, no tenderness/mass/nodules Back: symmetric, no curvature. ROM normal. No CVA tenderness. Lungs: clear to auscultation bilaterally Breasts: normal appearance, no masses or tenderness Heart: regular rate and rhythm, S1, S2 normal, no murmur, click, rub or gallop Abdomen: soft, non-tender; bowel sounds normal; no masses,  no organomegaly Extremities: extremities normal, atraumatic, no cyanosis or edema Pulses: 2+ and symmetric Skin: Skin color, texture, turgor normal. No  rashes or lesions Lymph nodes: Cervical, supraclavicular, and axillary nodes normal. Neurologic: Alert and oriented X 3, normal strength and tone. Normal symmetric reflexes. Normal coordination and gait    Assessment:    Healthy female exam.      Plan:     See After Visit Summary for Counseling Recommendations   Keep up a regular exercise program and make sure you are eating a healthy diet Try to eat 4 servings of dairy a day, or if you are lactose intolerant take a calcium with vitamin D daily.  Your vaccines are up to date.  Mammogram scheduled for January. Due for updated labs. Pneumonia vaccine given today.

## 2020-01-26 NOTE — Patient Instructions (Signed)

## 2020-01-27 ENCOUNTER — Encounter: Payer: Self-pay | Admitting: Family Medicine

## 2020-01-27 DIAGNOSIS — E785 Hyperlipidemia, unspecified: Secondary | ICD-10-CM

## 2020-01-29 NOTE — Telephone Encounter (Signed)
Ref placed yesterday.

## 2020-02-13 DIAGNOSIS — G4710 Hypersomnia, unspecified: Secondary | ICD-10-CM | POA: Diagnosis not present

## 2020-02-18 DIAGNOSIS — G4710 Hypersomnia, unspecified: Secondary | ICD-10-CM | POA: Diagnosis not present

## 2020-02-18 DIAGNOSIS — Z6831 Body mass index (BMI) 31.0-31.9, adult: Secondary | ICD-10-CM | POA: Diagnosis not present

## 2020-02-18 DIAGNOSIS — R0683 Snoring: Secondary | ICD-10-CM | POA: Diagnosis not present

## 2020-02-18 DIAGNOSIS — E6609 Other obesity due to excess calories: Secondary | ICD-10-CM | POA: Diagnosis not present

## 2020-03-05 DIAGNOSIS — Z8241 Family history of sudden cardiac death: Secondary | ICD-10-CM | POA: Diagnosis not present

## 2020-03-05 DIAGNOSIS — R002 Palpitations: Secondary | ICD-10-CM | POA: Diagnosis not present

## 2020-03-05 DIAGNOSIS — I471 Supraventricular tachycardia: Secondary | ICD-10-CM | POA: Diagnosis not present

## 2020-03-18 ENCOUNTER — Ambulatory Visit (INDEPENDENT_AMBULATORY_CARE_PROVIDER_SITE_OTHER): Payer: Medicare Other

## 2020-03-18 ENCOUNTER — Other Ambulatory Visit: Payer: Self-pay

## 2020-03-18 DIAGNOSIS — Z1231 Encounter for screening mammogram for malignant neoplasm of breast: Secondary | ICD-10-CM | POA: Diagnosis not present

## 2020-04-01 DIAGNOSIS — I471 Supraventricular tachycardia: Secondary | ICD-10-CM | POA: Diagnosis not present

## 2020-04-28 ENCOUNTER — Other Ambulatory Visit: Payer: Self-pay

## 2020-04-28 ENCOUNTER — Ambulatory Visit (INDEPENDENT_AMBULATORY_CARE_PROVIDER_SITE_OTHER): Payer: Medicare Other | Admitting: Family Medicine

## 2020-04-28 ENCOUNTER — Encounter: Payer: Self-pay | Admitting: Family Medicine

## 2020-04-28 VITALS — BP 124/59 | HR 74 | Ht 65.0 in | Wt 187.0 lb

## 2020-04-28 DIAGNOSIS — R0789 Other chest pain: Secondary | ICD-10-CM

## 2020-04-28 DIAGNOSIS — E785 Hyperlipidemia, unspecified: Secondary | ICD-10-CM

## 2020-04-28 DIAGNOSIS — N6322 Unspecified lump in the left breast, upper inner quadrant: Secondary | ICD-10-CM

## 2020-04-28 NOTE — Progress Notes (Addendum)
Acute Office Visit  Subjective:    Patient ID: Julia Perez, female    DOB: 10-21-1955, 65 y.o.   MRN: [id number]  No chief complaint on file.   HPI Patient is in today for breast pain that started little over a week ago.  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  The initial pain is brief though.  She denies any known injury or trauma to the area.  It seems to be coming and going she will notice it at rest even just sitting and reading a book and then other times she will notice it with activity.  But she cannot seem to recreate the discomfort.  She just had her mammogram in January about 5 weeks ago and with no abnormal findings.  He denies any excess belching.  No family history of coronary artery disease.  Follow-up lipids.  Has been exercising more and trying to eat more healthy.  She likes note her lipids are doing.  She has had prior cardiac work-up with Dr. Clovis Riley including a heart monitor, and stress echo.  Did not show any indication of blockages.   Past Medical History:  Diagnosis Date  . Palpitations     Past Surgical History:  Procedure Laterality Date  . bladder tac  1988  . Endoscopic pregnancy  1998  . HERNIA REPAIR  1959  . VAGINAL HYSTERECTOMY  1998    Family History  Problem Relation Age of Onset  . Breast cancer Mother 30       Postmenopausal  . Diabetes Mellitus II Mother   . Heart attack Father 28    Social History   Socioeconomic History  . Marital status: Married    Spouse name: [name]  . Number of children: 6  . Years of education: Not on file  . Highest education level: Not on file  Occupational History  . Occupation: hairstylist  Tobacco Use  . Smoking status: Never Smoker  .  Smokeless tobacco: Never Used  Vaping Use  . Vaping Use: Never used  Substance and Sexual Activity  . Alcohol use: No  . Drug use: No  . Sexual activity: Not Currently    Partners: Male  Other Topics Concern  . 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 She is married to [place] with 6 children. Her youngest daughter and third some of the home. 7 pregnancies and 6 deliveries with one miscarriage.   Social Determinants of Health   Financial Resource Strain: Not on file  Food Insecurity: Not on file  Transportation Needs: Not on file  Physical Activity: Not on file  Stress: Not on file  Social Connections: Not on file  Intimate Partner Violence: Not on file    Outpatient Medications Prior to Visit  Medication Sig Dispense Refill  . metoprolol tartrate (LOPRESSOR) 25 MG tablet Take 0.5 tablets by mouth 2 (two) times daily.      No facility-administered medications prior to visit.    Allergies  Allergen Reactions  . Sulfa Antibiotics Rash and Itching    Review of Systems     Objective:    Physical Exam  Constitutional:      Appearance: She is well-developed and well-nourished.  HENT:     Head: Normocephalic and atraumatic.  Cardiovascular:     Rate and Rhythm: Normal rate and regular rhythm.     Heart sounds: Normal heart sounds.  Pulmonary:     Effort: Pulmonary effort is normal.     Breath sounds: Normal breath sounds.  Chest:    Skin:    General: Skin is warm and dry.  Neurological:     Mental Status: She is alert and oriented to person, place, and time.  Psychiatric:        Mood and Affect: Mood and affect normal.        Behavior: Behavior normal.     BP (!) 124/59   Pulse 74   Ht 5\' 5"  (1.651 m)   Wt 187 lb (84.8 kg)   SpO2 100%   BMI 31.12 kg/m  Wt Readings from Last 3 Encounters:  04/28/20 187 lb (84.8 kg)  01/26/20 187 lb (84.8 kg)  01/19/20 189 lb 3.2 oz (85.8 kg)     There are no preventive care reminders to display for this patient.  There are no preventive care reminders to display for this patient.   Lab Results  Component Value Date   TSH 1.60 08/12/2019   Lab Results  Component Value Date   WBC 5.9 08/12/2019   HGB 13.1 08/12/2019   HCT 39.0 08/12/2019   MCV 89.7 08/12/2019   PLT 249 08/12/2019   Lab Results  Component Value Date   NA 140 01/26/2020   K 3.9 01/26/2020   CO2 27 01/26/2020   GLUCOSE 92 01/26/2020   BUN 12 01/26/2020   CREATININE 0.92 01/26/2020   BILITOT 0.8 01/26/2020   ALKPHOS 69 01/05/2016   AST 15 01/26/2020   ALT 17 01/26/2020   PROT 6.9 01/26/2020   ALBUMIN 4.5 01/05/2016   CALCIUM 9.3 01/26/2020   Lab Results  Component Value Date   CHOL 238 (H) 01/26/2020   Lab Results  Component Value Date   HDL 75 01/26/2020   Lab Results  Component Value Date   LDLCALC 138 (H) 01/26/2020   Lab Results  Component Value Date   TRIG 127 01/26/2020   Lab Results  Component Value Date   CHOLHDL 3.2 01/26/2020   No results found for: HGBA1C     Assessment & Plan:   Problem List Items Addressed This Visit      Other   Hyperlipidemia    She is been working hard with some dietary changes etc. to really work on reducing her lipids.  She like to be able to check on those again since it is been 3 months.  Lab slip order placed she can go at her convenience next week.      Relevant Orders   COMPLETE METABOLIC PANEL WITH GFR   Lipid Panel w/reflex Direct LDL    Other Visit Diagnoses    Breast lump on left side at 11 o'clock position    -  Primary   Relevant Orders   01/28/2020 BREAST COMPLETE UNI LEFT INC AXILLA   Costochondral chest pain         I do think most of the chest wall pain she is describing is most consistent with costochondral chest pain recommend a trial of Aleve for a couple of days avoid heavy lifting lifting, pushing pulling etc. with her chest and arms.  To see if this improves over the  next couple of weeks it does not sound cardiac in nature and it does not sound reflux triggered.  On exam though I did palpate a breast lump which I feel is most consistent with just fibrocystic tissue but I would like to get an ultrasound for further evaluation she just had a screening mammogram done about 5 weeks ago as well.  We will get that scheduled through the breast center in [place].  No orders of the defined types were placed in this encounter.    Nani Gasser, MD

## 2020-04-28 NOTE — Assessment & Plan Note (Signed)
She is been working hard with some dietary changes etc. to really work on reducing her lipids.  She like to be able to check on those again since it is been 3 months.  Lab slip order placed she can go at her convenience next week.

## 2020-05-04 DIAGNOSIS — H25013 Cortical age-related cataract, bilateral: Secondary | ICD-10-CM | POA: Diagnosis not present

## 2020-05-04 DIAGNOSIS — H43813 Vitreous degeneration, bilateral: Secondary | ICD-10-CM | POA: Diagnosis not present

## 2020-05-04 DIAGNOSIS — H5203 Hypermetropia, bilateral: Secondary | ICD-10-CM | POA: Diagnosis not present

## 2020-05-05 DIAGNOSIS — E785 Hyperlipidemia, unspecified: Secondary | ICD-10-CM | POA: Diagnosis not present

## 2020-05-06 ENCOUNTER — Other Ambulatory Visit: Payer: Self-pay | Admitting: Family Medicine

## 2020-05-06 DIAGNOSIS — N6322 Unspecified lump in the left breast, upper inner quadrant: Secondary | ICD-10-CM

## 2020-05-06 LAB — COMPLETE METABOLIC PANEL WITH GFR
AG Ratio: 1.9 (calc) (ref 1.0–2.5)
ALT: 16 U/L (ref 6–29)
AST: 16 U/L (ref 10–35)
Albumin: 4.5 g/dL (ref 3.6–5.1)
Alkaline phosphatase (APISO): 64 U/L (ref 37–153)
BUN: 12 mg/dL (ref 7–25)
CO2: 26 mmol/L (ref 20–32)
Calcium: 9.4 mg/dL (ref 8.6–10.4)
Chloride: 106 mmol/L (ref 98–110)
Creat: 0.81 mg/dL (ref 0.50–0.99)
GFR, Est African American: 88 mL/min/{1.73_m2} (ref >=60)
GFR, Est Non African American: 76 mL/min/{1.73_m2} (ref >=60)
Globulin: 2.4 g/dL (calc) (ref 1.9–3.7)
Glucose, Bld: 89 mg/dL (ref 65–99)
Potassium: 4 mmol/L (ref 3.5–5.3)
Sodium: 142 mmol/L (ref 135–146)
Total Bilirubin: 0.7 mg/dL (ref 0.2–1.2)
Total Protein: 6.9 g/dL (ref 6.1–8.1)

## 2020-05-06 LAB — LIPID PANEL W/REFLEX DIRECT LDL
Cholesterol: 225 mg/dL — ABNORMAL HIGH (ref <200)
HDL: 80 mg/dL (ref >=50)
LDL Cholesterol (Calc): 125 mg/dL (calc) — ABNORMAL HIGH
Non-HDL Cholesterol (Calc): 145 mg/dL (calc) — ABNORMAL HIGH (ref <130)
Total CHOL/HDL Ratio: 2.8 (calc) (ref <5.0)
Triglycerides: 94 mg/dL (ref <150)

## 2020-06-04 ENCOUNTER — Ambulatory Visit
Admission: RE | Admit: 2020-06-04 | Discharge: 2020-06-04 | Disposition: A | Discharge status: Home / Self Care | Payer: Medicare Other | Source: Ambulatory Visit | Attending: Family Medicine | Admitting: Family Medicine

## 2020-06-04 ENCOUNTER — Other Ambulatory Visit: Payer: Self-pay

## 2020-06-04 DIAGNOSIS — N644 Mastodynia: Secondary | ICD-10-CM | POA: Diagnosis not present

## 2020-06-04 DIAGNOSIS — N6322 Unspecified lump in the left breast, upper inner quadrant: Secondary | ICD-10-CM

## 2020-06-04 DIAGNOSIS — R922 Inconclusive mammogram: Secondary | ICD-10-CM | POA: Diagnosis not present

## 2020-06-07 DIAGNOSIS — H43813 Vitreous degeneration, bilateral: Secondary | ICD-10-CM | POA: Diagnosis not present

## 2020-06-07 DIAGNOSIS — H25013 Cortical age-related cataract, bilateral: Secondary | ICD-10-CM | POA: Diagnosis not present

## 2020-07-02 ENCOUNTER — Encounter: Payer: Self-pay | Admitting: Physician Assistant

## 2020-07-02 ENCOUNTER — Other Ambulatory Visit: Payer: Self-pay

## 2020-07-02 ENCOUNTER — Ambulatory Visit (INDEPENDENT_AMBULATORY_CARE_PROVIDER_SITE_OTHER): Payer: Medicare Other | Admitting: Physician Assistant

## 2020-07-02 VITALS — BP 132/58 | HR 70 | Ht 65.0 in | Wt 189.0 lb

## 2020-07-02 DIAGNOSIS — H6982 Other specified disorders of Eustachian tube, left ear: Secondary | ICD-10-CM

## 2020-07-02 DIAGNOSIS — H9202 Otalgia, left ear: Secondary | ICD-10-CM

## 2020-07-02 MED ORDER — METHYLPREDNISOLONE SODIUM SUCC 125 MG IJ SOLR
125.0000 mg | Freq: Once | INTRAMUSCULAR | Status: AC
  Administered 2020-07-02: 125 mg via INTRAMUSCULAR

## 2020-07-02 MED ORDER — FLUTICASONE PROPIONATE 50 MCG/ACT NA SUSP: 2.0000 | Freq: Every day | NASAL | 1 refills | Status: DC

## 2020-07-02 NOTE — Progress Notes (Addendum)
Acute Office Visit  Subjective:    Patient ID: Julia Perez, female    DOB: 02/25/1956, 65 y.o.   MRN: [id number]  Chief Complaint  Patient presents with  . Ear Pain    HPI 65 year old female presents with left ear and neck pain. She says the pain started about a week ago and has been intermittent. The pain is an achy feeling that shoots from her neck to her ear. Certain positional changes worsen the pain. She has a history of nasal septum defect and seasonal allergies. She has not tried anything for the pain. She denies any changes in hearing, dizziness, headaches, recent illness, or swelling. Does not seem to worsen after eating.   Past Medical History:  Diagnosis Date  . Palpitations     Past Surgical History:  Procedure Laterality Date  . bladder tac  1988  . Endoscopic pregnancy  1998  . HERNIA REPAIR  1959  . VAGINAL HYSTERECTOMY  1998    Family History  Problem Relation Age of Onset  . Breast cancer Mother 34       Postmenopausal  . Diabetes Mellitus II Mother   . Heart attack Father 68    Social History   Socioeconomic History  . Marital status: Married    Spouse name: [name]  . Number of children: 6  . Years of education: Not on file  . Highest education level: Not on file  Occupational History  . Occupation: hairstylist  Tobacco Use  . Smoking status: Never Smoker  . Smokeless tobacco: Never Used  Vaping Use  . Vaping Use: Never used  Substance and Sexual Activity  . Alcohol use: No  . Drug use: No  . Sexual activity: Not Currently    Partners: Male  Other Topics Concern  . 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 She is married to [place] with 6 children. Her youngest daughter and third some of the home. 7 pregnancies and 6 deliveries with one miscarriage.   Social Determinants of Health   Financial Resource Strain: Not on file  Food Insecurity: Not on file   Transportation Needs: Not on file  Physical Activity: Not on file  Stress: Not on file  Social Connections: Not on file  Intimate Partner Violence: Not on file    Outpatient Medications Prior to Visit  Medication Sig Dispense Refill  . metoprolol tartrate (LOPRESSOR) 25 MG tablet Take 0.5 tablets by mouth 2 (two) times daily.      No facility-administered medications prior to visit.    Allergies  Allergen Reactions  . Sulfa Antibiotics Rash and Itching    Review of Systems  Constitutional: Negative for fatigue and fever.  HENT: Positive for ear pain and postnasal drip. Negative for ear discharge, facial swelling, hearing loss, sinus pressure, sinus pain, sore throat and tinnitus.   Eyes: Negative for visual disturbance.  Respiratory: Negative for shortness of breath.   Cardiovascular: Negative for chest pain.  Gastrointestinal: Negative for abdominal pain and nausea.  Allergic/Immunologic: Positive for environmental allergies.  Neurological: Negative for dizziness, weakness, light-headedness and headaches.  Hematological: Negative for adenopathy.       Objective:    Physical Exam Constitutional:      Appearance: Normal appearance. She is not ill-appearing.  HENT:     Head: Normocephalic and atraumatic.     Right Ear: Tympanic membrane, ear canal and external ear normal.  Left Ear: Ear canal and external ear normal.     Ears:     Comments: Left TM bulging    Mouth/Throat:     Mouth: Mucous membranes are moist.     Pharynx: Oropharynx is clear. No oropharyngeal exudate or posterior oropharyngeal erythema.  Eyes:     Extraocular Movements: Extraocular movements intact.     Conjunctiva/sclera: Conjunctivae normal.     Pupils: Pupils are equal, round, and reactive to light.  Cardiovascular:     Rate and Rhythm: Normal rate and regular rhythm.     Pulses: Normal pulses.     Heart sounds: Normal heart sounds.  Pulmonary:     Effort: Pulmonary effort is normal.      Breath sounds: Normal breath sounds.  Musculoskeletal:     Cervical back: Normal range of motion and neck supple. No rigidity or tenderness.  Lymphadenopathy:     Cervical: No cervical adenopathy.  Skin:    General: Skin is warm and dry.  Neurological:     General: No focal deficit present.     Mental Status: She is alert.     BP (!) 132/58   Pulse 70   Ht 5\' 5"  (1.651 m)   Wt 189 lb (85.7 kg)   SpO2 100%   BMI 31.45 kg/m  Wt Readings from Last 3 Encounters:  07/02/20 189 lb (85.7 kg)  04/28/20 187 lb (84.8 kg)  01/26/20 187 lb (84.8 kg)      Assessment & Plan:  .12/01/21[name] was seen today for ear pain.  Diagnoses and all orders for this visit:  Left ear pain -     fluticasone (FLONASE) 50 MCG/ACT nasal spray; Place 2 sprays into both nostrils daily. -     methylPREDNISolone sodium succinate (SOLU-MEDROL) 125 mg/2 mL injection 125 mg  Dysfunction of left eustachian tube -     fluticasone (FLONASE) 50 MCG/ACT nasal spray; Place 2 sprays into both nostrils daily. -     methylPREDNISolone sodium succinate (SOLU-MEDROL) 125 mg/2 mL injection 125 mg   Patient had fluid behind left TM and signs of Eustachian tube dysfunction. No signs of infection. Cannot rule out some TMJ dysfunction but with TM bulging like more ETD.  Solumedrol 125mg  IM and prescription for Flonase 2 sprays each nostril daily. Follow up as needed and if symptoms persist or worsen.   Victorino Dike PA-C, have reviewed and agree with the above documentation in it's entirety.

## 2020-07-02 NOTE — Patient Instructions (Signed)

## 2020-07-29 DIAGNOSIS — I471 Supraventricular tachycardia: Secondary | ICD-10-CM | POA: Diagnosis not present

## 2020-09-02 DIAGNOSIS — I480 Paroxysmal atrial fibrillation: Secondary | ICD-10-CM | POA: Diagnosis not present

## 2020-09-02 DIAGNOSIS — Z8241 Family history of sudden cardiac death: Secondary | ICD-10-CM | POA: Diagnosis not present

## 2020-09-02 DIAGNOSIS — I471 Supraventricular tachycardia: Secondary | ICD-10-CM | POA: Diagnosis not present

## 2020-09-02 DIAGNOSIS — R002 Palpitations: Secondary | ICD-10-CM | POA: Diagnosis not present

## 2020-10-12 DIAGNOSIS — H43813 Vitreous degeneration, bilateral: Secondary | ICD-10-CM | POA: Diagnosis not present

## 2020-10-12 DIAGNOSIS — H16222 Keratoconjunctivitis sicca, not specified as Sjogren's, left eye: Secondary | ICD-10-CM | POA: Diagnosis not present

## 2020-10-12 DIAGNOSIS — H25013 Cortical age-related cataract, bilateral: Secondary | ICD-10-CM | POA: Diagnosis not present

## 2020-10-27 DIAGNOSIS — I480 Paroxysmal atrial fibrillation: Secondary | ICD-10-CM | POA: Diagnosis not present

## 2020-10-27 DIAGNOSIS — R0789 Other chest pain: Secondary | ICD-10-CM | POA: Diagnosis not present

## 2020-10-27 DIAGNOSIS — I471 Supraventricular tachycardia: Secondary | ICD-10-CM | POA: Diagnosis not present

## 2020-11-12 DIAGNOSIS — I2510 Atherosclerotic heart disease of native coronary artery without angina pectoris: Secondary | ICD-10-CM | POA: Diagnosis not present

## 2020-11-12 DIAGNOSIS — I708 Atherosclerosis of other arteries: Secondary | ICD-10-CM | POA: Diagnosis not present

## 2020-11-12 DIAGNOSIS — I480 Paroxysmal atrial fibrillation: Secondary | ICD-10-CM | POA: Diagnosis not present

## 2020-11-12 DIAGNOSIS — I471 Supraventricular tachycardia: Secondary | ICD-10-CM | POA: Diagnosis not present

## 2020-11-30 ENCOUNTER — Telehealth: Payer: Self-pay | Admitting: *Deleted

## 2020-11-30 DIAGNOSIS — I471 Supraventricular tachycardia: Secondary | ICD-10-CM

## 2020-11-30 DIAGNOSIS — E785 Hyperlipidemia, unspecified: Secondary | ICD-10-CM

## 2020-11-30 NOTE — Telephone Encounter (Signed)
Pt asked if Dr. Linford Arnold would order labs for her cardiologist because it would cost her to have them done at [facility].   Orders placed and asked that the results be CC'd to Dr. Sherren Kerns at [facility].

## 2020-12-03 DIAGNOSIS — E785 Hyperlipidemia, unspecified: Secondary | ICD-10-CM | POA: Diagnosis not present

## 2020-12-03 DIAGNOSIS — I471 Supraventricular tachycardia: Secondary | ICD-10-CM | POA: Diagnosis not present

## 2020-12-03 LAB — COMPLETE METABOLIC PANEL WITH GFR
AG Ratio: 2 (calc) (ref 1.0–2.5)
ALT: 20 U/L (ref 6–29)
AST: 19 U/L (ref 10–35)
Albumin: 4.2 g/dL (ref 3.6–5.1)
Alkaline phosphatase (APISO): 66 U/L (ref 37–153)
BUN: 13 mg/dL (ref 7–25)
CO2: 29 mmol/L (ref 20–32)
Calcium: 9.1 mg/dL (ref 8.6–10.4)
Chloride: 104 mmol/L (ref 98–110)
Creat: 0.8 mg/dL (ref 0.50–1.05)
Globulin: 2.1 g/dL (calc) (ref 1.9–3.7)
Glucose, Bld: 86 mg/dL (ref 65–99)
Potassium: 3.9 mmol/L (ref 3.5–5.3)
Sodium: 140 mmol/L (ref 135–146)
Total Bilirubin: 0.9 mg/dL (ref 0.2–1.2)
Total Protein: 6.3 g/dL (ref 6.1–8.1)
eGFR: 81 mL/min/{1.73_m2} (ref >=60)

## 2020-12-03 LAB — LIPID PANEL W/REFLEX DIRECT LDL
Cholesterol: 215 mg/dL — ABNORMAL HIGH (ref <200)
HDL: 67 mg/dL (ref >=50)
LDL Cholesterol (Calc): 127 mg/dL (calc) — ABNORMAL HIGH
Non-HDL Cholesterol (Calc): 148 mg/dL (calc) — ABNORMAL HIGH (ref <130)
Total CHOL/HDL Ratio: 3.2 (calc) (ref <5.0)
Triglycerides: 104 mg/dL (ref <150)

## 2020-12-06 DIAGNOSIS — H16222 Keratoconjunctivitis sicca, not specified as Sjogren's, left eye: Secondary | ICD-10-CM | POA: Diagnosis not present

## 2020-12-06 DIAGNOSIS — H25013 Cortical age-related cataract, bilateral: Secondary | ICD-10-CM | POA: Diagnosis not present

## 2020-12-06 DIAGNOSIS — H43813 Vitreous degeneration, bilateral: Secondary | ICD-10-CM | POA: Diagnosis not present

## 2020-12-06 LAB — HM DIABETES EYE EXAM

## 2020-12-07 NOTE — Progress Notes (Signed)
Karuna, LDL cholesterol is mildly elevated similar to the last 3 years.  Still continue to work on healthy diet and regular exercise it looked its best about 3 years ago.  Metabolic panel looks great.

## 2020-12-09 DIAGNOSIS — I471 Supraventricular tachycardia: Secondary | ICD-10-CM | POA: Diagnosis not present

## 2020-12-09 DIAGNOSIS — I480 Paroxysmal atrial fibrillation: Secondary | ICD-10-CM | POA: Diagnosis not present

## 2020-12-09 DIAGNOSIS — I2510 Atherosclerotic heart disease of native coronary artery without angina pectoris: Secondary | ICD-10-CM | POA: Diagnosis not present

## 2021-01-10 DIAGNOSIS — I471 Supraventricular tachycardia: Secondary | ICD-10-CM | POA: Diagnosis not present

## 2021-01-10 DIAGNOSIS — I480 Paroxysmal atrial fibrillation: Secondary | ICD-10-CM | POA: Diagnosis not present

## 2021-01-10 DIAGNOSIS — E785 Hyperlipidemia, unspecified: Secondary | ICD-10-CM | POA: Diagnosis not present

## 2021-01-10 DIAGNOSIS — I2510 Atherosclerotic heart disease of native coronary artery without angina pectoris: Secondary | ICD-10-CM | POA: Diagnosis not present

## 2021-01-10 DIAGNOSIS — Z6830 Body mass index (BMI) 30.0-30.9, adult: Secondary | ICD-10-CM | POA: Diagnosis not present

## 2021-01-19 DIAGNOSIS — Z7689 Persons encountering health services in other specified circumstances: Secondary | ICD-10-CM | POA: Diagnosis not present

## 2021-01-19 DIAGNOSIS — E6609 Other obesity due to excess calories: Secondary | ICD-10-CM | POA: Diagnosis not present

## 2021-01-19 DIAGNOSIS — Z7189 Other specified counseling: Secondary | ICD-10-CM | POA: Diagnosis not present

## 2021-01-19 DIAGNOSIS — Z6830 Body mass index (BMI) 30.0-30.9, adult: Secondary | ICD-10-CM | POA: Diagnosis not present

## 2021-01-26 ENCOUNTER — Encounter: Payer: Self-pay | Admitting: Family Medicine

## 2021-01-26 ENCOUNTER — Other Ambulatory Visit: Payer: Self-pay

## 2021-01-26 ENCOUNTER — Ambulatory Visit (INDEPENDENT_AMBULATORY_CARE_PROVIDER_SITE_OTHER): Payer: Medicare Other | Admitting: Family Medicine

## 2021-01-26 VITALS — BP 124/60 | HR 66 | Ht 65.0 in | Wt 181.0 lb

## 2021-01-26 DIAGNOSIS — I471 Supraventricular tachycardia: Secondary | ICD-10-CM

## 2021-01-26 DIAGNOSIS — Z0000 Encounter for general adult medical examination without abnormal findings: Secondary | ICD-10-CM | POA: Diagnosis not present

## 2021-01-26 DIAGNOSIS — E785 Hyperlipidemia, unspecified: Secondary | ICD-10-CM

## 2021-01-26 DIAGNOSIS — Z23 Encounter for immunization: Secondary | ICD-10-CM

## 2021-01-26 MED ORDER — ATORVASTATIN CALCIUM 20 MG PO TABS
20.0000 mg | ORAL_TABLET | Freq: Every day | ORAL | 3 refills | Status: DC
Start: 2021-01-26 — End: 2022-01-18

## 2021-01-26 NOTE — Assessment & Plan Note (Addendum)
Toprol all was not controlling her symptoms so she is now on Cardizem and that seems to be working well.  Cardiology is wanting her to consider possibly a blood thinner more than just her 81 mg aspirin.  She only had 1 episode of atrial fibrillation.  She does have a follow-up coming up soon and will discuss with them further.

## 2021-01-26 NOTE — Progress Notes (Signed)
Subjective:     Julia Perez is a 65 y.o. female and is here for a comprehensive physical exam. The patient reports no problems. She exercises regularly.    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 She is married to [place] with 6 children. Her youngest daughter and third some of the home. 7 pregnancies and 6 deliveries with one miscarriage.   Social Determinants of Health   Financial Resource Strain: Not on file  Food Insecurity: Not on file  Transportation Needs: Not on file  Physical Activity: Not on file  Stress: Not on file  Social Connections: Not on file  Intimate Partner Violence: Not on file   Health Maintenance  Topic Date Due   COVID-19 Vaccine (4 - Booster for Moderna series) 03/08/2020   MAMMOGRAM  03/18/2022   COLONOSCOPY (Pts 45-48yrs Insurance coverage will need to be confirmed)  07/29/2023   TETANUS/TDAP  01/08/2028   Pneumonia Vaccine 55+ Years old  Completed   INFLUENZA VACCINE  Completed   DEXA SCAN  Completed   Hepatitis C Screening  Completed   Zoster Vaccines- Shingrix  Completed   HPV VACCINES  Aged Out    The following portions of the patient's history were reviewed and updated as appropriate: allergies, current medications, past family history, past medical history, past social history, past surgical history, and problem list.  Review of Systems Pertinent items are noted in HPI.   Objective:     BP 124/60   Pulse 66   Ht 5\' 5"  (1.651 m)   Wt 181 lb (82.1 kg)   SpO2 100%   BMI 30.12 kg/m  General appearance: alert, cooperative, and appears stated age Head: Normocephalic, without obvious abnormality, atraumatic Eyes:  conj clear, EOMI, PEERLA Ears: normal TM's and external ear canals both ears Nose: Nares normal. Septum midline. Mucosa normal. No drainage or sinus tenderness. Throat: lips, mucosa, and tongue normal; teeth and gums normal Neck: no adenopathy, no carotid bruit, no JVD, supple, symmetrical, trachea midline, and thyroid not enlarged, symmetric, no tenderness/mass/nodules Back: symmetric, no curvature. ROM normal. No CVA tenderness. Lungs: clear to auscultation bilaterally Breasts: normal appearance, no masses or tenderness Heart: regular rate and rhythm, S1, S2 normal, no murmur, click, rub or gallop Abdomen: soft, non-tender; bowel sounds normal; no masses,  no organomegaly Extremities: extremities normal, atraumatic, no cyanosis or edema Pulses: 2+ and symmetric Skin: Skin color, texture, turgor normal. No rashes or lesions Lymph nodes: Cervical, supraclavicular, and axillary nodes normal. Neurologic: Grossly normal    Assessment:    Healthy female exam.      Plan:     See After Visit Summary for Counseling Recommendations  Keep up a regular exercise program and make sure you are eating a healthy diet Try to eat 4 servings of dairy a day, or if you are lactose intolerant take a  calcium with vitamin D daily.  Your vaccines are up to date.   Hyperlipidemia We did discuss starting a low-dose statin as she also recently had a CT calcium score which indicated some mild claudication.  And her LDLs are slightly above 100.  We will start with atorvastatin 20 mg daily.  New prescription sent to pharmacy we can recheck lipids and liver enzymes in 3 months.  Paroxysmal supraventricular tachycardia (HCC) Toprol all was not controlling her symptoms so she is now  on Cardizem and that seems to be working well.  Cardiology is wanting her to consider possibly a blood thinner more than just her 81 mg aspirin.  She only had 1 episode of atrial fibrillation.  She does have a follow-up coming up soon and will discuss with them further.

## 2021-01-26 NOTE — Assessment & Plan Note (Signed)
We did discuss starting a low-dose statin as she also recently had a CT calcium score which indicated some mild claudication.  And her LDLs are slightly above 100.  We will start with atorvastatin 20 mg daily.  New prescription sent to pharmacy we can recheck lipids and liver enzymes in 3 months.

## 2021-01-31 ENCOUNTER — Telehealth: Payer: Medicare Other | Admitting: Family Medicine

## 2021-02-01 ENCOUNTER — Ambulatory Visit (INDEPENDENT_AMBULATORY_CARE_PROVIDER_SITE_OTHER): Payer: Medicare Other | Admitting: Family Medicine

## 2021-02-01 DIAGNOSIS — Z1231 Encounter for screening mammogram for malignant neoplasm of breast: Secondary | ICD-10-CM

## 2021-02-01 DIAGNOSIS — Z0000 Encounter for general adult medical examination without abnormal findings: Secondary | ICD-10-CM | POA: Diagnosis not present

## 2021-02-01 NOTE — Progress Notes (Signed)
MEDICARE ANNUAL WELLNESS VISIT  02/01/2021  Telephone Visit Disclaimer This Medicare AWV was conducted by telephone due to national recommendations for restrictions regarding the COVID-19 Pandemic (e.g. social distancing).  I verified, using two identifiers, that I am speaking with Julia Perez or their authorized healthcare agent. I discussed the limitations, risks, security, and privacy concerns of performing an evaluation and management service by telephone and the potential availability of an in-person appointment in the future. The patient expressed understanding and agreed to proceed.  Location of Patient: Home Location of Provider (nurse):  In the office.  Subjective:    Julia Perez is a 65 y.o. female patient of Metheney, Barbarann Ehlers, MD who had a Medicare Annual Wellness Visit today via telephone. Julia Perez is Working part time and lives with their spouse. she has 6 children. she reports that she is socially active and does interact with friends/family regularly. she is moderately physically active and enjoys quilting, sewing, knitting and yardwork.  Patient Care Team: Agapito Games, MD as PCP - General (Family Medicine)  Advanced Directives 02/01/2021 01/19/2020 01/13/2019 01/26/2016  Does Patient Have a Medical Advance Directive? Yes Yes Yes Yes  Type of Advance Directive Living will;Healthcare Power of [employer] Power of [place];Living will;Out of facility DNR (pink MOST or yellow form) Healthcare Power of [place];Living will -  Does patient want to make changes to medical advance directive? No - Patient declined No - Patient declined No - Patient declined No - Patient declined  Copy of Healthcare Power of Attorney in Chart? No - copy requested No - copy requested No - copy requested [facility] Utilization Over the Past 12 Months: # of hospitalizations or ER visits: 0 # of surgeries: 0  Review of Systems    Patient reports that her overall  health is unchanged compared to last year.  History obtained from chart review and the patient  Patient Reported Readings (BP, Pulse, CBG, Weight, etc) none  Pain Assessment Pain : No/denies pain     Current Medications & Allergies (verified) Allergies as of 02/01/2021       Reactions   Metoprolol Other (See Comments)   Stomach pain    Sulfa Antibiotics Rash, Itching        Medication List        Accurate as of February 01, 2021  3:22 PM. If you have any questions, ask your nurse or doctor.          aspirin 81 MG EC tablet Take by mouth.   atorvastatin 20 MG tablet Commonly known as: LIPITOR Take 1 tablet (20 mg total) by mouth daily.   diltiazem 120 MG 24 hr capsule Commonly known as: CARDIZEM CD Take 120 mg by mouth daily.   Thera Tabs Take 1 tablet by mouth daily.        History (reviewed): Past Medical History:  Diagnosis Date   Anxiety 1974   Arthritis 1990   GERD (gastroesophageal reflux disease) Last 6 months   Caused by medication   Hyperlipidemia Last 3 years   Just started taking a statin   Palpitations    Past Surgical History:  Procedure Laterality Date   bladder tac  02/27/1986   Endoscopic pregnancy  02/28/1996   HERNIA REPAIR  02/27/1957   VAGINAL HYSTERECTOMY  02/28/1996   Family History  Problem Relation Age of Onset   Breast cancer Mother 11       Postmenopausal   Diabetes Mellitus II Mother  Anxiety disorder Mother    Arthritis Mother    Cancer Mother    Diabetes Mother    Miscarriages / Stillbirths Mother    Heart attack Father 47   Early death Father    Diabetes Daughter    Social History   Socioeconomic History   Marital status: Married    Spouse name: [name]   Number of children: 6   Years of education: 16   Highest education level: Bachelor's degree (e.g., BA, AB, BS)  Occupational History   Occupation: hairstylist  Tobacco Use   Smoking status: Never   Smokeless tobacco: Never  Vaping Use    Vaping Use: Never used  Substance and Sexual Activity   Alcohol use: No   Drug use: No   Sexual activity: Not Currently    Partners: Male    Birth control/protection: Post-menopausal  Other Topics Concern   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 She is married to [place] with 6 children. Her son is moving back in and is also currently primary caretaker. She enjoys quilting, sewing, knitting and yard work.    Social Determinants of Health   Financial Resource Strain: Low Risk    Difficulty of Paying Living Expenses: Not hard at all  Food Insecurity: No Food Insecurity   Worried About Programme researcher, broadcasting/film/video in the Last Year: Never true   Ran Out of Food in the Last Year: Never true  Transportation Needs: No Transportation Needs   Lack of Transportation (Medical): No   Lack of Transportation (Non-Medical): No  Physical Activity: Sufficiently Active   Days of Exercise per Week: 6 days   Minutes of Exercise per Session: 30 min  Stress: No Stress Concern Present   Feeling of Stress : Not at all  Social Connections: Moderately Integrated   Frequency of Communication with Friends and Family: More than three times a week   Frequency of Social Gatherings with Friends and Family: More than three times a week   Attends Religious Services: More than 4 times per year   Active Member of [employer] or Organizations: No   Attends Banker Meetings: Never   Marital Status: Married    Activities of Daily Living In your present state of health, do you have any difficulty performing the following activities: 02/01/2021 01/31/2021  Hearing? N N  Vision? N N  Difficulty concentrating or making decisions? N N  Walking or climbing stairs? N N  Dressing or bathing? N N  Doing errands, shopping? N N  Preparing Food and eating ? N N  Using the Toilet? N N  In the past six months, have you accidently leaked urine? Y Y  Do  you have problems with loss of bowel control? N N  Managing your Medications? N N  Managing your Finances? N N  Housekeeping or managing your Housekeeping? N N  Some recent data might be hidden    Patient Education/ Literacy How often do you need to have someone help you when you read instructions, pamphlets, or other written materials from your doctor or pharmacy?: 1 - Never What is the last grade level you completed in school?: Bachelor's degree  Exercise Current Exercise Habits: Home exercise routine, Type of exercise: walking, Time (Minutes): 30, Frequency (Times/Week): 6, Weekly Exercise (Minutes/Week): 180, Intensity: Moderate, Exercise limited by: None identified  Diet Patient reports consuming 3 meals a day and 1 snack(s) a day Patient reports  that her primary diet is: Regular Patient reports that she does have regular access to food.   Depression Screen PHQ 2/9 Scores 02/01/2021 01/26/2021 01/26/2020 01/19/2020 08/12/2019 01/13/2019 11/01/2018  PHQ - 2 Score 0 0 0 0 2 1 0  PHQ- 9 Score - - - - 3 - -     Fall Risk Fall Risk  02/01/2021 01/31/2021 01/26/2021 01/26/2020 01/19/2020  Falls in the past year? 0 0 0 0 0  Number falls in past yr: 0 0 0 - 0  Injury with Fall? 0 0 0 - 0  Risk for fall due to : No Fall Risks - No Fall Risks No Fall Risks -  Follow up Falls evaluation completed - Falls prevention discussed;Falls evaluation completed Falls evaluation completed Falls evaluation completed     Objective:  Julia Perez seemed alert and oriented and she participated appropriately during our telephone visit.  Blood Pressure Weight BMI  BP Readings from Last 3 Encounters:  01/26/21 124/60  07/02/20 (!) 132/58  04/28/20 (!) 124/59   Wt Readings from Last 3 Encounters:  01/26/21 181 lb (82.1 kg)  07/02/20 189 lb (85.7 kg)  04/28/20 187 lb (84.8 kg)   BMI Readings from Last 1 Encounters:  01/26/21 30.12 kg/m    *Unable to obtain current vital signs, weight, and BMI  due to telephone visit type  Hearing/Vision  Julia Perez did not seem to have difficulty with hearing/understanding during the telephone conversation Reports that she has had a formal eye exam by an eye care professional within the past year Reports that she has not had a formal hearing evaluation within the past year *Unable to fully assess hearing and vision during telephone visit type  Cognitive Function: 6CIT Screen 02/01/2021 01/19/2020 01/13/2019  What Year? 0 points 0 points 0 points  What month? 0 points 0 points 0 points  What time? 0 points 0 points 0 points  Count back from 20 0 points 0 points 0 points  Months in reverse - 0 points 0 points  Repeat phrase - 0 points 0 points  Total Score - 0 0   (Normal:0-7, Significant for Dysfunction: >8)  Normal Cognitive Function Screening: Yes   Immunization & Health Maintenance Record Immunization History  Administered Date(s) Administered   Fluad Quad(high Dose 65+) 01/26/2021   Influenza Split 01/12/2020   Influenza,inj,Quad PF,6+ Mos 01/04/2017, 01/07/2018, 11/01/2018   Influenza-Unspecified 12/15/2015   Moderna Sars-Covid-2 Vaccination 05/21/2019, 06/18/2019, 01/12/2020   Pneumococcal Conjugate-13 01/13/2019   Pneumococcal Polysaccharide-23 01/26/2020   Tdap 03/02/2008, 01/07/2018   Zoster Recombinat (Shingrix) 01/04/2017, 06/28/2017   Zoster, Live 01/05/2016    Health Maintenance  Topic Date Due   COVID-19 Vaccine (4 - Booster for Moderna series) 02/17/2021 (Originally 03/08/2020)   MAMMOGRAM  03/18/2022   COLONOSCOPY (Pts 45-82yrs Insurance coverage will need to be confirmed)  07/29/2023   TETANUS/TDAP  01/08/2028   Pneumonia Vaccine 36+ Years old  Completed   INFLUENZA VACCINE  Completed   DEXA SCAN  Completed   Hepatitis C Screening  Completed   Zoster Vaccines- Shingrix  Completed   HPV VACCINES  Aged Out       Assessment  This is a routine wellness examination for Julia Perez.  Health Maintenance:  Due or Overdue There are no preventive care reminders to display for this patient.   Julia Perez does not need a referral for Community Assistance: Care Management:   no Social Work:    no Prescription Assistance:  no Nutrition/Diabetes  Education:  no   Plan:  Personalized Goals  Goals Addressed               This Visit's Progress     Patient Stated (pt-stated)        Would like to loose 20-25 lbs.       Personalized Health Maintenance & Screening Recommendations  Screening mammography - due in January.  Lung Cancer Screening Recommended: no (Low Dose CT Chest recommended if Age 78-80 years, 30 pack-year currently smoking OR have quit w/in past 15 years) Hepatitis C Screening recommended: no HIV Screening recommended: no  Advanced Directives: Written information was not prepared per patient's request.  Referrals & Orders Orders Placed This Encounter  Procedures   Mammogram 3D SCREEN BREAST BILATERAL     Follow-up Plan Follow-up with Agapito Games, MD as planned Medicare wellness visit in one year. Patient will access AVS on mychart.  I have personally reviewed and noted the following in the patient's chart:   Medical and social history Use of alcohol, tobacco or illicit drugs  Current medications and supplements Functional ability and status Nutritional status Physical activity Advanced directives List of other physicians Hospitalizations, surgeries, and ER visits in previous 12 months Vitals Screenings to include cognitive, depression, and falls Referrals and appointments  In addition, I have reviewed and discussed with Julia Perez certain preventive protocols, quality metrics, and best practice recommendations. A written personalized care plan for preventive services as well as general preventive health recommendations is available and can be mailed to the patient at her request.      Modesto Charon, RN  02/01/2021

## 2021-02-01 NOTE — Patient Instructions (Addendum)
MEDICARE ANNUAL WELLNESS VISIT Health Maintenance Summary and Written Plan of Care  Julia Perez ,  Thank you for allowing me to perform your Medicare Annual Wellness Visit and for your ongoing commitment to your health.   Health Maintenance & Immunization History Health Maintenance  Topic Date Due   COVID-19 Vaccine (4 - Booster for Moderna series) 02/17/2021 (Originally 03/08/2020)   MAMMOGRAM  03/18/2022   COLONOSCOPY (Pts 45-67yrs Insurance coverage will need to be confirmed)  07/29/2023   TETANUS/TDAP  01/08/2028   Pneumonia Vaccine 70+ Years old  Completed   INFLUENZA VACCINE  Completed   DEXA SCAN  Completed   Hepatitis C Screening  Completed   Zoster Vaccines- Shingrix  Completed   HPV VACCINES  Aged Out   Immunization History  Administered Date(s) Administered   Fluad Quad(high Dose 65+) 01/26/2021   Influenza Split 01/12/2020   Influenza,inj,Quad PF,6+ Mos 01/04/2017, 01/07/2018, 11/01/2018   Influenza-Unspecified 12/15/2015   Moderna Sars-Covid-2 Vaccination 05/21/2019, 06/18/2019, 01/12/2020   Pneumococcal Conjugate-13 01/13/2019   Pneumococcal Polysaccharide-23 01/26/2020   Tdap 03/02/2008, 01/07/2018   Zoster Recombinat (Shingrix) 01/04/2017, 06/28/2017   Zoster, Live 01/05/2016    These are the patient goals that we discussed:  Goals Addressed               This Visit's Progress     Patient Stated (pt-stated)        Would like to loose 20-25 lbs.         This is a list of Health Maintenance Items that are overdue or due now: Screening mammography - due in January, 2023.    Orders/Referrals Placed Today: Orders Placed This Encounter  Procedures   Mammogram 3D SCREEN BREAST BILATERAL    Standing Status:   Future    Standing Expiration Date:   02/01/2022    Scheduling Instructions:     Please call patient to schedule. She is due after 03/18/21.    Order Specific Question:   Reason for Exam (SYMPTOM  OR DIAGNOSIS REQUIRED)    Answer:   Breast  cancer screening    Order Specific Question:   Preferred imaging location?    Answer:   [facility]    (Contact our referral department at [phone] if you have not spoken with someone about your referral appointment within the next 5 days)    Follow-up Plan Follow-up with Agapito Games, MD as planned Medicare wellness visit in one year. Patient will access AVS on mychart.    Health Maintenance, Female Adopting a healthy lifestyle and getting preventive care are important in promoting health and wellness. Ask your health care provider about: The right schedule for you to have regular tests and exams. Things you can do on your own to prevent diseases and keep yourself healthy. What should I know about diet, weight, and exercise? Eat a healthy diet  Eat a diet that includes plenty of vegetables, fruits, low-fat dairy products, and lean protein. Do not eat a lot of foods that are high in solid fats, added sugars, or sodium. Maintain a healthy weight Body mass index (BMI) is used to identify weight problems. It estimates body fat based on height and weight. Your health care provider can help determine your BMI and help you achieve or maintain a healthy weight. Get regular exercise Get regular exercise. This is one of the most important things you can do for your health. Most adults should: Exercise for at least 150 minutes each week. The exercise should increase  your heart rate and make you sweat (moderate-intensity exercise). Do strengthening exercises at least twice a week. This is in addition to the moderate-intensity exercise. Spend less time sitting. Even light physical activity can be beneficial. Watch cholesterol and blood lipids Have your blood tested for lipids and cholesterol at 67 years of age, then have this test every 5 years. Have your cholesterol levels checked more often if: Your lipid or cholesterol levels are high. You are older than 67 years of  age. You are at high risk for heart disease. What should I know about cancer screening? Depending on your health history and family history, you may need to have cancer screening at various ages. This may include screening for: Breast cancer. Cervical cancer. Colorectal cancer. Skin cancer. Lung cancer. What should I know about heart disease, diabetes, and high blood pressure? Blood pressure and heart disease High blood pressure causes heart disease and increases the risk of stroke. This is more likely to develop in people who have high blood pressure readings or are overweight. Have your blood pressure checked: Every 3-5 years if you are 74-68 years of age. Every year if you are 74 years old or older. Diabetes Have regular diabetes screenings. This checks your fasting blood sugar level. Have the screening done: Once every three years after age 33 if you are at a normal weight and have a low risk for diabetes. More often and at a younger age if you are overweight or have a high risk for diabetes. What should I know about preventing infection? Hepatitis B If you have a higher risk for hepatitis B, you should be screened for this virus. Talk with your health care provider to find out if you are at risk for hepatitis B infection. Hepatitis C Testing is recommended for: Everyone born from [birth year] through 1967. Anyone with known risk factors for hepatitis C. Sexually transmitted infections (STIs) Get screened for STIs, including gonorrhea and chlamydia, if: You are sexually active and are younger than 67 years of age. You are older than 67 years of age and your health care provider tells you that you are at risk for this type of infection. Your sexual activity has changed since you were last screened, and you are at increased risk for chlamydia or gonorrhea. Ask your health care provider if you are at risk. Ask your health care provider about whether you are at high risk for HIV. Your health  care provider may recommend a prescription medicine to help prevent HIV infection. If you choose to take medicine to prevent HIV, you should first get tested for HIV. You should then be tested every 3 months for as long as you are taking the medicine. Pregnancy If you are about to stop having your period (premenopausal) and you may become pregnant, seek counseling before you get pregnant. Take 400 to 800 micrograms (mcg) of folic acid every day if you become pregnant. Ask for birth control (contraception) if you want to prevent pregnancy. Osteoporosis and menopause Osteoporosis is a disease in which the bones lose minerals and strength with aging. This can result in bone fractures. If you are 86 years old or older, or if you are at risk for osteoporosis and fractures, ask your health care provider if you should: Be screened for bone loss. Take a calcium or vitamin D supplement to lower your risk of fractures. Be given hormone replacement therapy (HRT) to treat symptoms of menopause. Follow these instructions at home: Alcohol use Do not drink  alcohol if: Your health care provider tells you not to drink. You are pregnant, may be pregnant, or are planning to become pregnant. If you drink alcohol: Limit how much you have to: 0-1 drink a day. Know how much alcohol is in your drink. In the [place] one drink equals one 12 oz bottle of beer (355 mL), one 5 oz glass of wine (148 mL), or one 1 oz glass of hard liquor (44 mL). Lifestyle Do not use any products that contain nicotine or tobacco. These products include cigarettes, chewing tobacco, and vaping devices, such as e-cigarettes. If you need help quitting, ask your health care provider. Do not use street drugs. Do not share needles. Ask your health care provider for help if you need support or information about quitting drugs. General instructions Schedule regular health, dental, and eye exams. Stay current with your vaccines. Tell your health  care provider if: You often feel depressed. You have ever been abused or do not feel safe at home. Summary Adopting a healthy lifestyle and getting preventive care are important in promoting health and wellness. Follow your health care provider's instructions about healthy diet, exercising, and getting tested or screened for diseases. Follow your health care provider's instructions on monitoring your cholesterol and blood pressure. This information is not intended to replace advice given to you by your health care provider. Make sure you discuss any questions you have with your health care provider. Document Revised: 07/05/2020 Document Reviewed: 07/05/2020 Elsevier Patient Education  2022 [employer].

## 2021-02-04 DIAGNOSIS — I471 Supraventricular tachycardia: Secondary | ICD-10-CM | POA: Diagnosis not present

## 2021-02-04 DIAGNOSIS — I480 Paroxysmal atrial fibrillation: Secondary | ICD-10-CM | POA: Diagnosis not present

## 2021-03-08 DIAGNOSIS — I2510 Atherosclerotic heart disease of native coronary artery without angina pectoris: Secondary | ICD-10-CM | POA: Diagnosis not present

## 2021-03-08 DIAGNOSIS — I10 Essential (primary) hypertension: Secondary | ICD-10-CM | POA: Diagnosis not present

## 2021-03-08 DIAGNOSIS — G8911 Acute pain due to trauma: Secondary | ICD-10-CM | POA: Diagnosis not present

## 2021-03-08 DIAGNOSIS — S5001XA Contusion of right elbow, initial encounter: Secondary | ICD-10-CM | POA: Diagnosis not present

## 2021-03-08 DIAGNOSIS — Z79899 Other long term (current) drug therapy: Secondary | ICD-10-CM | POA: Diagnosis not present

## 2021-03-08 DIAGNOSIS — E6609 Other obesity due to excess calories: Secondary | ICD-10-CM | POA: Diagnosis not present

## 2021-03-08 DIAGNOSIS — S59911A Unspecified injury of right forearm, initial encounter: Secondary | ICD-10-CM | POA: Diagnosis not present

## 2021-03-08 DIAGNOSIS — Z6830 Body mass index (BMI) 30.0-30.9, adult: Secondary | ICD-10-CM | POA: Diagnosis not present

## 2021-03-08 DIAGNOSIS — I4891 Unspecified atrial fibrillation: Secondary | ICD-10-CM | POA: Diagnosis not present

## 2021-03-14 ENCOUNTER — Ambulatory Visit (INDEPENDENT_AMBULATORY_CARE_PROVIDER_SITE_OTHER): Payer: Medicare Other | Admitting: Sports Medicine

## 2021-03-14 ENCOUNTER — Ambulatory Visit (INDEPENDENT_AMBULATORY_CARE_PROVIDER_SITE_OTHER): Payer: Medicare Other

## 2021-03-14 ENCOUNTER — Other Ambulatory Visit: Payer: Self-pay

## 2021-03-14 DIAGNOSIS — M25521 Pain in right elbow: Secondary | ICD-10-CM

## 2021-03-14 DIAGNOSIS — W19XXXA Unspecified fall, initial encounter: Secondary | ICD-10-CM | POA: Diagnosis not present

## 2021-03-14 DIAGNOSIS — S52121A Displaced fracture of head of right radius, initial encounter for closed fracture: Secondary | ICD-10-CM | POA: Insufficient documentation

## 2021-03-14 NOTE — Assessment & Plan Note (Signed)
6 days ago this very pleasant 66 year old female fell onto her right arm, she had immediate pain, swelling, seen in the emergency department at [facility], x-rays were unremarkable, she was appropriately placed in a sling, she came out of the sling yesterday and had some increasing pain. On exam she has severe tenderness at the radial head and neck, pain with pronation and supination all consistent with a radial head fracture, these fractures are easily missed on x-rays so we will treated as such. 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 Expect 6 weeks for full healing. I did recommend calcium and vitamin D supplementation

## 2021-03-14 NOTE — Progress Notes (Signed)
° ° °  Procedures performed today:    None.  Independent interpretation of notes and tests performed by another provider:   None.  Brief History, Exam, Impression, and Recommendations:    Right elbow pain 6 days ago this very pleasant 66 year old female fell onto her right arm, she had immediate pain, swelling, seen in the emergency department at [facility], x-rays were unremarkable, she was appropriately placed in a sling, she came out of the sling yesterday and had some increasing pain. On exam she has severe tenderness at the radial head and neck, pain with pronation and supination all consistent with a radial head fracture, these fractures are easily missed on x-rays so we will treated as such. 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 Expect 6 weeks for full healing. I did recommend calcium and vitamin D supplementation    ___________________________________________ Ihor Austin. Benjamin Stain, M.D., ABFM., CAQSM. Primary Care and Sports Medicine [facility]  Adjunct Instructor of Family Medicine  [employer]

## 2021-03-23 ENCOUNTER — Ambulatory Visit: Payer: Medicare Other

## 2021-03-23 DIAGNOSIS — E6609 Other obesity due to excess calories: Secondary | ICD-10-CM | POA: Diagnosis not present

## 2021-03-23 DIAGNOSIS — Z6830 Body mass index (BMI) 30.0-30.9, adult: Secondary | ICD-10-CM | POA: Diagnosis not present

## 2021-03-31 DIAGNOSIS — R1013 Epigastric pain: Secondary | ICD-10-CM | POA: Diagnosis not present

## 2021-03-31 DIAGNOSIS — R142 Eructation: Secondary | ICD-10-CM | POA: Diagnosis not present

## 2021-03-31 DIAGNOSIS — R002 Palpitations: Secondary | ICD-10-CM | POA: Diagnosis not present

## 2021-04-04 DIAGNOSIS — Q402 Other specified congenital malformations of stomach: Secondary | ICD-10-CM | POA: Diagnosis not present

## 2021-04-04 DIAGNOSIS — K209 Esophagitis, unspecified without bleeding: Secondary | ICD-10-CM | POA: Diagnosis not present

## 2021-04-04 DIAGNOSIS — R1013 Epigastric pain: Secondary | ICD-10-CM | POA: Diagnosis not present

## 2021-04-04 DIAGNOSIS — K2289 Other specified disease of esophagus: Secondary | ICD-10-CM | POA: Diagnosis not present

## 2021-04-06 DIAGNOSIS — E6609 Other obesity due to excess calories: Secondary | ICD-10-CM | POA: Diagnosis not present

## 2021-04-06 DIAGNOSIS — Z6830 Body mass index (BMI) 30.0-30.9, adult: Secondary | ICD-10-CM | POA: Diagnosis not present

## 2021-04-08 DIAGNOSIS — N838 Other noninflammatory disorders of ovary, fallopian tube and broad ligament: Secondary | ICD-10-CM | POA: Diagnosis not present

## 2021-04-08 DIAGNOSIS — K8020 Calculus of gallbladder without cholecystitis without obstruction: Secondary | ICD-10-CM | POA: Diagnosis not present

## 2021-04-08 DIAGNOSIS — N2889 Other specified disorders of kidney and ureter: Secondary | ICD-10-CM | POA: Diagnosis not present

## 2021-04-08 DIAGNOSIS — R1013 Epigastric pain: Secondary | ICD-10-CM | POA: Diagnosis not present

## 2021-04-11 ENCOUNTER — Telehealth: Payer: Self-pay | Admitting: Family Medicine

## 2021-04-11 ENCOUNTER — Ambulatory Visit (INDEPENDENT_AMBULATORY_CARE_PROVIDER_SITE_OTHER): Payer: Medicare Other | Admitting: Sports Medicine

## 2021-04-11 ENCOUNTER — Other Ambulatory Visit: Payer: Self-pay

## 2021-04-11 DIAGNOSIS — N9489 Other specified conditions associated with female genital organs and menstrual cycle: Secondary | ICD-10-CM

## 2021-04-11 DIAGNOSIS — S52124D Nondisplaced fracture of head of right radius, subsequent encounter for closed fracture with routine healing: Secondary | ICD-10-CM | POA: Diagnosis not present

## 2021-04-11 NOTE — Telephone Encounter (Signed)
Orders Placed This Encounter  Procedures   US Pelvic Complete With Transvaginal    Standing Status:   Future    Standing Expiration Date:   04/11/2022    Scheduling Instructions:     Please schedule about 6 weeks out.    Order Specific Question:   Reason for Exam (SYMPTOM  OR DIAGNOSIS REQUIRED)    Answer:   adnexal  mass f/u from imaging done in ED    Order Specific Question:   Preferred imaging location?    Answer:   [facility]

## 2021-04-11 NOTE — Assessment & Plan Note (Signed)
This is a pleasant 66 year old female, she is now approximately 4 weeks status post a fall onto her right arm, immediate pain, swelling. She was seen in the emergency department at [facility] where x-rays were read as unremarkable. We obtained a new set of x-rays, also read as unremarkable however on my personal review I did see a step-off at the radial head. We did get radiology to addend the report, she is doing a lot better today 4 weeks post fracture, she should stay out of the sling, continue to use her arm and avoid lifting greater than 5 pounds for the next 2 weeks. Return to see me on an as-needed basis.

## 2021-04-11 NOTE — Progress Notes (Signed)
° ° °  Procedures performed today:    None.  Independent interpretation of notes and tests performed by another provider:   None.  Brief History, Exam, Impression, and Recommendations:    Fracture of radial head, right, closed This is a pleasant 66 year old female, she is now approximately 4 weeks status post a fall onto her right arm, immediate pain, swelling. She was seen in the emergency department at [facility] where x-rays were read as unremarkable. We obtained a new set of x-rays, also read as unremarkable however on my personal review I did see a step-off at the radial head. We did get radiology to addend the report, she is doing a lot better today 4 weeks post fracture, she should stay out of the sling, continue to use her arm and avoid lifting greater than 5 pounds for the next 2 weeks. Return to see me on an as-needed basis.    ___________________________________________ Ihor Austin. Benjamin Stain, M.D., ABFM., CAQSM. Primary Care and Sports Medicine [place] [facility]  Adjunct Instructor of Family Medicine  [employer]

## 2021-04-11 NOTE — Telephone Encounter (Signed)
Please call patient and let her know that we need to schedule her for a pelvic ultrasound to follow-up on the ovarian cyst seen in the emergency department in about 6 weeks.  We can schedule that at our location if that is convenient or if there is someone else that she would like to go then please let us know.

## 2021-04-11 NOTE — Telephone Encounter (Signed)
Lil has agreed to have the ultrasound.

## 2021-04-13 ENCOUNTER — Other Ambulatory Visit: Payer: Self-pay

## 2021-04-13 ENCOUNTER — Encounter: Payer: Self-pay | Admitting: Family Medicine

## 2021-04-13 ENCOUNTER — Ambulatory Visit (INDEPENDENT_AMBULATORY_CARE_PROVIDER_SITE_OTHER): Payer: Medicare Other

## 2021-04-13 DIAGNOSIS — Z0000 Encounter for general adult medical examination without abnormal findings: Secondary | ICD-10-CM

## 2021-04-13 DIAGNOSIS — Z1231 Encounter for screening mammogram for malignant neoplasm of breast: Secondary | ICD-10-CM

## 2021-04-13 NOTE — Progress Notes (Signed)
Abstracted colonocopys

## 2021-04-13 NOTE — Progress Notes (Signed)
Please call patient. Normal mammogram.  Repeat in 1 year.  

## 2021-04-20 ENCOUNTER — Ambulatory Visit (INDEPENDENT_AMBULATORY_CARE_PROVIDER_SITE_OTHER): Payer: Medicare Other

## 2021-04-20 ENCOUNTER — Ambulatory Visit (INDEPENDENT_AMBULATORY_CARE_PROVIDER_SITE_OTHER): Payer: Medicare Other | Admitting: Family Medicine

## 2021-04-20 ENCOUNTER — Other Ambulatory Visit: Payer: Self-pay

## 2021-04-20 ENCOUNTER — Encounter: Payer: Self-pay | Admitting: Family Medicine

## 2021-04-20 VITALS — BP 134/70 | HR 66 | Resp 16 | Ht 65.0 in | Wt 178.0 lb

## 2021-04-20 DIAGNOSIS — N9489 Other specified conditions associated with female genital organs and menstrual cycle: Secondary | ICD-10-CM | POA: Diagnosis not present

## 2021-04-20 DIAGNOSIS — M542 Cervicalgia: Secondary | ICD-10-CM | POA: Diagnosis not present

## 2021-04-20 DIAGNOSIS — E785 Hyperlipidemia, unspecified: Secondary | ICD-10-CM

## 2021-04-20 DIAGNOSIS — R200 Anesthesia of skin: Secondary | ICD-10-CM | POA: Diagnosis not present

## 2021-04-20 DIAGNOSIS — K8020 Calculus of gallbladder without cholecystitis without obstruction: Secondary | ICD-10-CM | POA: Diagnosis not present

## 2021-04-20 DIAGNOSIS — R8290 Unspecified abnormal findings in urine: Secondary | ICD-10-CM

## 2021-04-20 LAB — POCT URINALYSIS DIP (CLINITEK)
Bilirubin, UA: NEGATIVE
Glucose, UA: NEGATIVE mg/dL
Ketones, POC UA: NEGATIVE mg/dL
Nitrite, UA: NEGATIVE
POC PROTEIN,UA: NEGATIVE
Spec Grav, UA: 1.015 (ref 1.010–1.025)
Urobilinogen, UA: 0.2 E.U./dL
pH, UA: 7 (ref 5.0–8.0)

## 2021-04-20 NOTE — Assessment & Plan Note (Signed)
Following statin well.  Due to recheck lipids.

## 2021-04-20 NOTE — Progress Notes (Signed)
Established Patient Office Visit  Subjective:  Patient ID: Julia Perez, female    DOB: 1955-07-07  Age: 66 y.o. MRN: [id number]  CC:  Chief Complaint  Patient presents with   Discuss [place] results    Abdomen done by [facility].    Discuss Medication    Patient would like to discuss Diltiazem and Aspirin daily due to acid reflux. Patient currently taking Aspirin twice a week.    Labs Only    Patient would like Lipid levels checked     HPI [name] presents for   Discus US done by [facility] for her GI sxs.  Ultrasound showed #1 cholelithiasis without evidence of acute cholecystitis.  2 mild fullness of the right collecting system without frank hydronephrosis.  3 cystic structure in the left adnexa measuring 2.1 cm.  Patient would like to discuss Diltiazem and Aspirin daily due to acid reflux. Patient currently taking Aspirin twice a week.  She definitely feels like there is a direct correlation between her diltiazem and has some of her GI symptoms that she is being currently evaluated for.  So she is strongly considering having a cardiac ablation so that she could actually come off of medication.  She also had some questions about her aspirin.  She had 1 very short episode of A-fib documented by EMS but has not had indication that is ever happened again.  She had a cardiac monitor that did not show A-fib.  She is been tolerating her statin well without any significant problems or side effects.  She is excited to recheck her lipid levels and see how they are doing.  She like to do that today.  She is also had several episodes of feeling a sudden numbness in the back of her throat.  She says the first few times she noticed that she had been wearing a arm sling and she thought maybe the sling was pulling on her neck because it was creating a lot of pain and soreness.  The sensation with the numbness was fairly brief.  Since then it is happened a couple more times.  Once while she had her head  turned while she was cutting hair and she had sort of been in a bent position for a little while and suddenly felt that numbness and tingling again but this time it actually crossed up to her nose.  She has had an x-ray previously but says its been years ago.  She has noticed a change in odor of her urine.  Past Medical History:  Diagnosis Date   Anxiety 1974   Arthritis 1990   GERD (gastroesophageal reflux disease) Last 6 months   Caused by medication   Hyperlipidemia Last 3 years   Just started taking a statin   Palpitations     Past Surgical History:  Procedure Laterality Date   bladder tac  02/27/1986   Endoscopic pregnancy  02/28/1996   HERNIA REPAIR  02/27/1957   VAGINAL HYSTERECTOMY  02/28/1996    Family History  Problem Relation Age of Onset   Breast cancer Mother 33       Postmenopausal   Diabetes Mellitus II Mother    Anxiety disorder Mother    Arthritis Mother    Cancer Mother    Diabetes Mother    Miscarriages / Stillbirths Mother    Heart attack Father 32   Early death Father    Diabetes Daughter     Social History   Socioeconomic History  Marital status: Married    Spouse name: Leory Plowman   Number of children: 6   Years of education: 16   Highest education level: Bachelor's degree (e.g., BA, AB, BS)  Occupational History   Occupation: hairstylist  Tobacco Use   Smoking status: Never   Smokeless tobacco: Never  Vaping Use   Vaping Use: Never used  Substance and Sexual Activity   Alcohol use: No   Drug use: No   Sexual activity: Not Currently    Partners: Male    Birth control/protection: Post-menopausal  Other Topics Concern   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 She is married to Wheeler with 6 children. Her son is moving back in and is also currently primary caretaker. She enjoys quilting, sewing, knitting and yard work.    Social Determinants of Health   Financial  Resource Strain: Low Risk    Difficulty of Paying Living Expenses: Not hard at all  Food Insecurity: No Food Insecurity   Worried About Charity fundraiser in the Last Year: Never true   [place] in the Last Year: Never true  Transportation Needs: No Transportation Needs   Lack of Transportation (Medical): No   Lack of Transportation (Non-Medical): No  Physical Activity: Sufficiently Active   Days of Exercise per Week: 6 days   Minutes of Exercise per Session: 30 min  Stress: No Stress Concern Present   Feeling of Stress : Not at all  Social Connections: Moderately Integrated   Frequency of Communication with Friends and Family: More than three times a week   Frequency of Social Gatherings with Friends and Family: More than three times a week   Attends Religious Services: More than 4 times per year   Active Member of [employer] or Organizations: No   Attends Archivist Meetings: Never   Marital Status: Married  Human resources officer Violence: Not At Risk   Fear of Current or Ex-Partner: No   Emotionally Abused: No   Physically Abused: No   Sexually Abused: No    Outpatient Medications Prior to Visit  Medication Sig Dispense Refill   aspirin 81 MG EC tablet Take by mouth. Twice a week     atorvastatin (LIPITOR) 20 MG tablet Take 1 tablet (20 mg total) by mouth daily. 90 tablet 3   diltiazem (CARDIZEM CD) 120 MG 24 hr capsule Take 120 mg by mouth daily.     famotidine (PEPCID) 20 MG tablet Take 10 mg by mouth daily. 10 mg daily     Multiple Vitamin (THERA) TABS Take 1 tablet by mouth daily.     No facility-administered medications prior to visit.    Allergies  Allergen Reactions   Metoprolol Other (See Comments)    Stomach pain    Sulfa Antibiotics Rash and Itching    ROS Review of Systems    Objective:    Physical Exam Constitutional:      Appearance: Normal appearance. She is well-developed.  HENT:     Head: Normocephalic and atraumatic.  Cardiovascular:      Rate and Rhythm: Normal rate and regular rhythm.     Heart sounds: Normal heart sounds.  Pulmonary:     Effort: Pulmonary effort is normal.     Breath sounds: Normal breath sounds.  Skin:    General: Skin is warm and dry.  Neurological:     Mental Status: She is alert and oriented to person,  place, and time.  Psychiatric:        Behavior: Behavior normal.    BP 134/70 (BP Location: Left Arm)    Pulse 66    Resp 16    Ht '5\' 5"'  (1.651 m)    Wt 178 lb (80.7 kg)    SpO2 99%    BMI 29.62 kg/m  Wt Readings from Last 3 Encounters:  04/20/21 178 lb (80.7 kg)  01/26/21 181 lb (82.1 kg)  07/02/20 189 lb (85.7 kg)     There are no preventive care reminders to display for this patient.  There are no preventive care reminders to display for this patient.  Lab Results  Component Value Date   TSH 1.60 08/12/2019   Lab Results  Component Value Date   WBC 5.9 08/12/2019   HGB 13.1 08/12/2019   HCT 39.0 08/12/2019   MCV 89.7 08/12/2019   PLT 249 08/12/2019   Lab Results  Component Value Date   NA 140 12/03/2020   K 3.9 12/03/2020   CO2 29 12/03/2020   GLUCOSE 86 12/03/2020   BUN 13 12/03/2020   CREATININE 0.80 12/03/2020   BILITOT 0.9 12/03/2020   ALKPHOS 69 01/05/2016   AST 19 12/03/2020   ALT 20 12/03/2020   PROT 6.3 12/03/2020   ALBUMIN 4.5 01/05/2016   CALCIUM 9.1 12/03/2020   EGFR 81 12/03/2020   Lab Results  Component Value Date   CHOL 215 (H) 12/03/2020   Lab Results  Component Value Date   HDL 67 12/03/2020   Lab Results  Component Value Date   LDLCALC 127 (H) 12/03/2020   Lab Results  Component Value Date   TRIG 104 12/03/2020   Lab Results  Component Value Date   CHOLHDL 3.2 12/03/2020   No results found for: HGBA1C    Assessment & Plan:   Problem List Items Addressed This Visit       Digestive   Cholelithiasis    We discussed that right now she is not having any active symptoms consistent with cholecystitis but just being aware that  she does have gallstones and to monitor for any new or worrisome symptoms.        Other   Hyperlipidemia - Primary    Following statin well.  Due to recheck lipids.      Relevant Orders   COMPLETE METABOLIC PANEL WITH GFR   Lipid Panel w/reflex Direct LDL   Cervical pain    Is actually not having significant neck pain as much as the intermittent numbness and tingling.  We will get a start with a plain film for further evaluation.  I can work-up further if needed.      Relevant Orders   DG Cervical Spine Complete   Other Visit Diagnoses     Adnexal mass       Abnormal urine odor       Relevant Orders   POCT URINALYSIS DIP (CLINITEK) (Completed)   Urine Culture   Urinalysis, microscopic only       We did review the ultrasound results together.  We discussed that she currently does not have any symptoms consistent with acute cholecystitis.  But to monitor for those.  We discussed that sometimes a gallstone can just be present and never actually cause problems but sometimes can.  Adnexal mass-we have already ordered her ultrasound and she is scheduled for further evaluation.  Urine odor-urinalysis showed some blood and leukocytes we will send for microscopic review to see if  there are whole red blood cells as well as a culture we will call with results once available.  Aspirin prophylaxis-discussed that if she is not truly in A-fib or paroxysmal A-fib then there really is not an indication for aspirin at this point in time.  She may want to circle back with her cardiology and maybe even consider another cardiac monitor just to rule out paroxysmal A-fib before discontinuing completely.  No orders of the defined types were placed in this encounter.   Follow-up: No follow-ups on file.    Beatrice Lecher, MD

## 2021-04-20 NOTE — Assessment & Plan Note (Signed)
We discussed that right now she is not having any active symptoms consistent with cholecystitis but just being aware that she does have gallstones and to monitor for any new or worrisome symptoms.

## 2021-04-20 NOTE — Assessment & Plan Note (Signed)
Is actually not having significant neck pain as much as the intermittent numbness and tingling.  We will get a start with a plain film for further evaluation.  I can work-up further if needed.

## 2021-04-21 ENCOUNTER — Encounter: Payer: Self-pay | Admitting: Family Medicine

## 2021-04-21 LAB — COMPLETE METABOLIC PANEL WITH GFR
AG Ratio: 1.8 (calc) (ref 1.0–2.5)
ALT: 13 U/L (ref 6–29)
AST: 15 U/L (ref 10–35)
Albumin: 4.4 g/dL (ref 3.6–5.1)
Alkaline phosphatase (APISO): 73 U/L (ref 37–153)
BUN: 10 mg/dL (ref 7–25)
CO2: 28 mmol/L (ref 20–32)
Calcium: 9.1 mg/dL (ref 8.6–10.4)
Chloride: 106 mmol/L (ref 98–110)
Creat: 0.79 mg/dL (ref 0.50–1.05)
Globulin: 2.4 g/dL (calc) (ref 1.9–3.7)
Glucose, Bld: 90 mg/dL (ref 65–99)
Potassium: 3.7 mmol/L (ref 3.5–5.3)
Sodium: 141 mmol/L (ref 135–146)
Total Bilirubin: 0.9 mg/dL (ref 0.2–1.2)
Total Protein: 6.8 g/dL (ref 6.1–8.1)
eGFR: 82 mL/min/{1.73_m2} (ref >=60)

## 2021-04-21 LAB — LIPID PANEL W/REFLEX DIRECT LDL
Cholesterol: 160 mg/dL (ref <200)
HDL: 73 mg/dL (ref >=50)
LDL Cholesterol (Calc): 70 mg/dL (calc)
Non-HDL Cholesterol (Calc): 87 mg/dL (calc) (ref <130)
Total CHOL/HDL Ratio: 2.2 (calc) (ref <5.0)
Triglycerides: 91 mg/dL (ref <150)

## 2021-04-21 NOTE — Progress Notes (Signed)
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  The disc spaces actually look pretty good so no significant disc herniation.  An x-ray would not pick up on minor herniations but it does show as if there is a large 1 which is reassuring.  I still think this could be positional related.  1 option would be to consider physical therapy for your neck and then if the symptoms continue then we may need to do further work-up.

## 2021-04-21 NOTE — Progress Notes (Signed)
Hi Julia Perez, the dipstick showed a little bit of blood and leukocytes.  So we sent for microscopic review there were no whole red blood cells which is very reassuring.  We also sent it for culture so that usually takes a couple days to come back.  Your metabolic panel looks good.  Your cholesterol looks phenomenal.  Your LDL went from 127 down to 70 which is fantastic!  So lets just continue with your current regimen.

## 2021-04-22 LAB — URINE CULTURE
MICRO NUMBER:: 13046700
SPECIMEN QUALITY:: ADEQUATE

## 2021-04-22 LAB — URINALYSIS, MICROSCOPIC ONLY
Bacteria, UA: NONE SEEN /HPF
Hyaline Cast: NONE SEEN /LPF
RBC / HPF: NONE SEEN /HPF (ref 0–2)
Squamous Epithelial / HPF: NONE SEEN /HPF (ref <=5)
WBC, UA: NONE SEEN /HPF (ref 0–5)

## 2021-04-23 MED ORDER — AMOXICILLIN-POT CLAVULANATE 875-125 MG PO TABS: 1.0000 | ORAL_TABLET | Freq: Two times a day (BID) | ORAL | 0 refills | Status: DC

## 2021-04-23 NOTE — Progress Notes (Signed)
Hi Breshay your urine culture is positive for a skin bacteria.  This is probably a contaminant but to be on safe side I am going to send over an antibiotic to take for 3 days.    Orders Placed This Encounter     amoxicillin-clavulanate (AUGMENTIN) 875-125 MG tablet         Sig: Take 1 tablet by mouth 2 (two) times daily.         Dispense:  6 tablet         Refill:  0

## 2021-04-23 NOTE — Addendum Note (Signed)
Addended by: Nani Gasser D on: 04/23/2021 12:02 PM   Modules accepted: Orders

## 2021-04-26 DIAGNOSIS — Z6830 Body mass index (BMI) 30.0-30.9, adult: Secondary | ICD-10-CM | POA: Diagnosis not present

## 2021-04-26 DIAGNOSIS — E6609 Other obesity due to excess calories: Secondary | ICD-10-CM | POA: Diagnosis not present

## 2021-04-27 ENCOUNTER — Encounter: Payer: Self-pay | Admitting: Family Medicine

## 2021-04-27 DIAGNOSIS — R0789 Other chest pain: Secondary | ICD-10-CM | POA: Diagnosis not present

## 2021-04-27 DIAGNOSIS — Z8241 Family history of sudden cardiac death: Secondary | ICD-10-CM | POA: Diagnosis not present

## 2021-04-27 DIAGNOSIS — I471 Supraventricular tachycardia: Secondary | ICD-10-CM | POA: Diagnosis not present

## 2021-04-27 DIAGNOSIS — R002 Palpitations: Secondary | ICD-10-CM | POA: Diagnosis not present

## 2021-04-27 DIAGNOSIS — I2510 Atherosclerotic heart disease of native coronary artery without angina pectoris: Secondary | ICD-10-CM | POA: Diagnosis not present

## 2021-04-27 DIAGNOSIS — I480 Paroxysmal atrial fibrillation: Secondary | ICD-10-CM | POA: Diagnosis not present

## 2021-04-27 MED ORDER — NYSTATIN 100000 UNIT/ML MT SUSP: 5.0000 mL | Freq: Four times a day (QID) | OROMUCOSAL | 0 refills | Status: DC

## 2021-05-23 ENCOUNTER — Other Ambulatory Visit: Payer: Self-pay

## 2021-05-23 ENCOUNTER — Ambulatory Visit (INDEPENDENT_AMBULATORY_CARE_PROVIDER_SITE_OTHER): Payer: Medicare Other

## 2021-05-23 DIAGNOSIS — N9489 Other specified conditions associated with female genital organs and menstrual cycle: Secondary | ICD-10-CM

## 2021-05-23 DIAGNOSIS — N83292 Other ovarian cyst, left side: Secondary | ICD-10-CM | POA: Diagnosis not present

## 2021-05-23 DIAGNOSIS — R9389 Abnormal findings on diagnostic imaging of other specified body structures: Secondary | ICD-10-CM

## 2021-05-23 DIAGNOSIS — Z90710 Acquired absence of both cervix and uterus: Secondary | ICD-10-CM | POA: Diagnosis not present

## 2021-05-24 DIAGNOSIS — E6609 Other obesity due to excess calories: Secondary | ICD-10-CM | POA: Diagnosis not present

## 2021-05-24 DIAGNOSIS — Z6830 Body mass index (BMI) 30.0-30.9, adult: Secondary | ICD-10-CM | POA: Diagnosis not present

## 2021-05-31 ENCOUNTER — Encounter: Payer: Self-pay | Admitting: Family Medicine

## 2021-06-07 DIAGNOSIS — Z6830 Body mass index (BMI) 30.0-30.9, adult: Secondary | ICD-10-CM | POA: Diagnosis not present

## 2021-06-07 DIAGNOSIS — E6609 Other obesity due to excess calories: Secondary | ICD-10-CM | POA: Diagnosis not present

## 2021-06-10 NOTE — Progress Notes (Signed)
Julia Perez, you do have a cyst on your left ovary measuring around 2.9 cm.  They said that it looks benign so no worrisome features no thickening or septations or nodules which is great.'s they did not recommend any additional follow-up in the future's because it does look benign.

## 2021-06-23 DIAGNOSIS — Z6830 Body mass index (BMI) 30.0-30.9, adult: Secondary | ICD-10-CM | POA: Diagnosis not present

## 2021-06-23 DIAGNOSIS — E6609 Other obesity due to excess calories: Secondary | ICD-10-CM | POA: Diagnosis not present

## 2021-07-07 DIAGNOSIS — E6609 Other obesity due to excess calories: Secondary | ICD-10-CM | POA: Diagnosis not present

## 2021-07-07 DIAGNOSIS — Z6830 Body mass index (BMI) 30.0-30.9, adult: Secondary | ICD-10-CM | POA: Diagnosis not present

## 2021-07-11 DIAGNOSIS — I471 Supraventricular tachycardia: Secondary | ICD-10-CM | POA: Diagnosis not present

## 2021-07-11 DIAGNOSIS — I2510 Atherosclerotic heart disease of native coronary artery without angina pectoris: Secondary | ICD-10-CM | POA: Diagnosis not present

## 2021-07-11 DIAGNOSIS — I480 Paroxysmal atrial fibrillation: Secondary | ICD-10-CM | POA: Diagnosis not present

## 2021-07-11 DIAGNOSIS — R002 Palpitations: Secondary | ICD-10-CM | POA: Diagnosis not present

## 2021-08-11 IMAGING — US US BREAST*L* LIMITED INC AXILLA
1 series · 4 of 4 positions shown · non-contrast
Comparison: Previous exam(s).

CLINICAL DATA: 64-year-old presenting with chronic intermittent
focal pain involving the UPPER INNER QUADRANT of the LEFT breast.

EXAM:
DIGITAL DIAGNOSTIC UNILATERAL LEFT MAMMOGRAM WITH TOMOSYNTHESIS AND
CAD; ULTRASOUND LEFT BREAST LIMITED
TECHNIQUE: Left digital diagnostic mammography and breast tomosynthesis was
performed. The images were evaluated with computer-aided detection.;
Targeted ultrasound examination of the left breast was performed

[Series 1: us breast*left* limited inc axilla · 0.07mm/px · 4 of 4 slices shown]
[im 1/4]
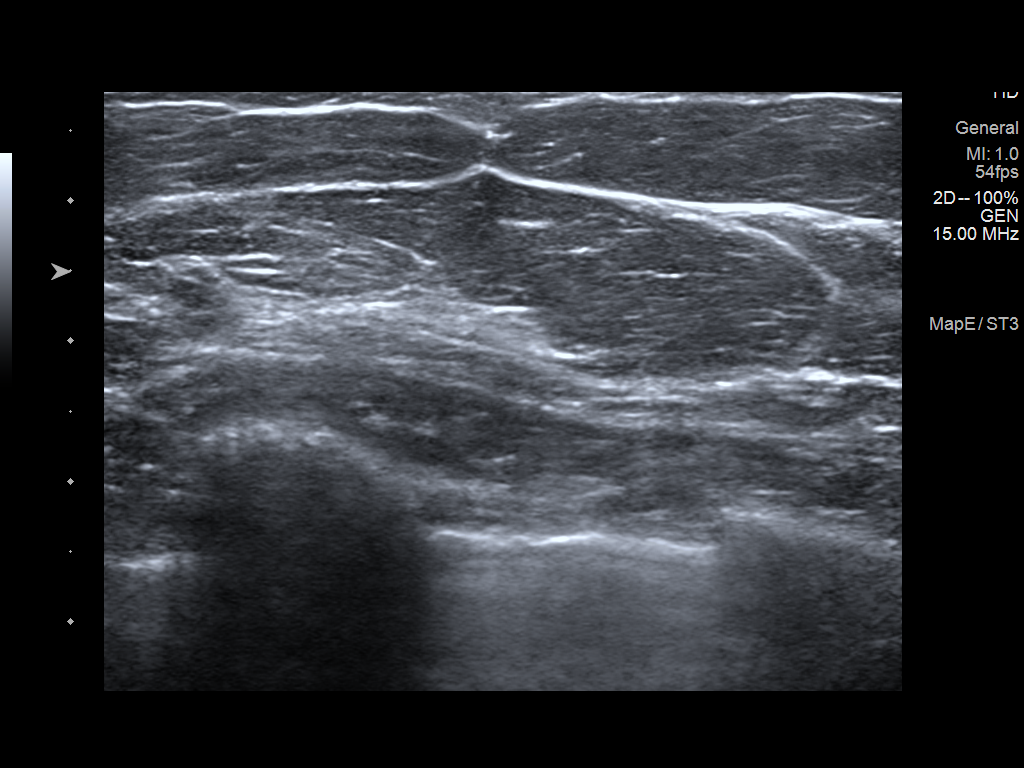
[im 2/4]
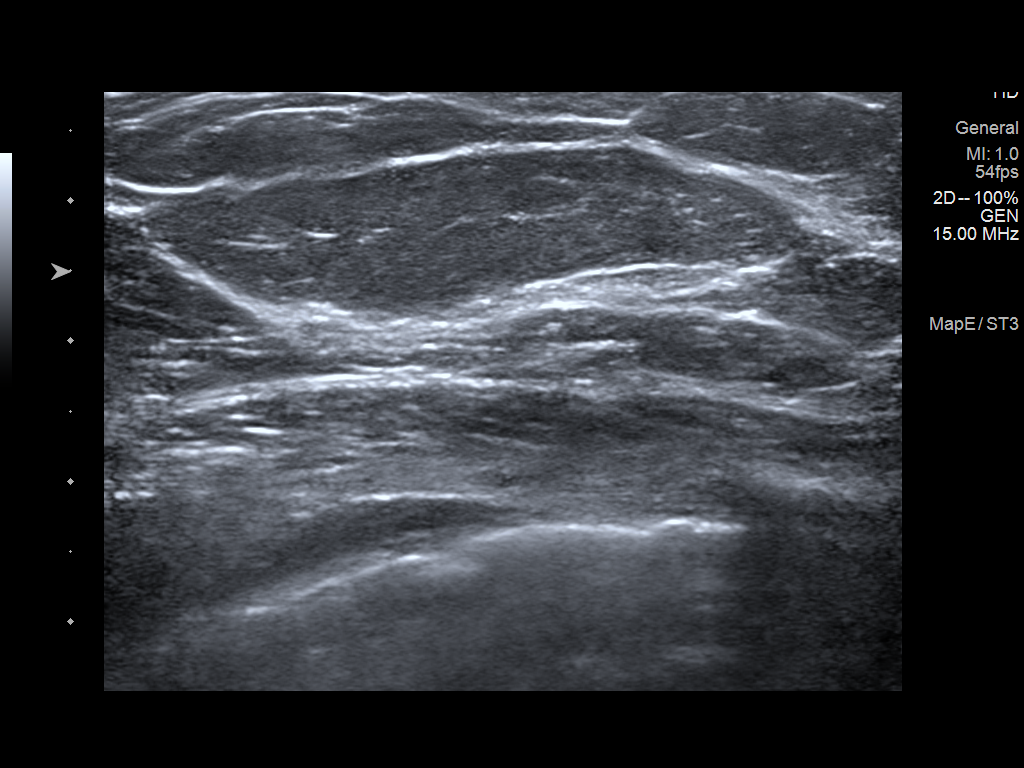
[im 3/4]
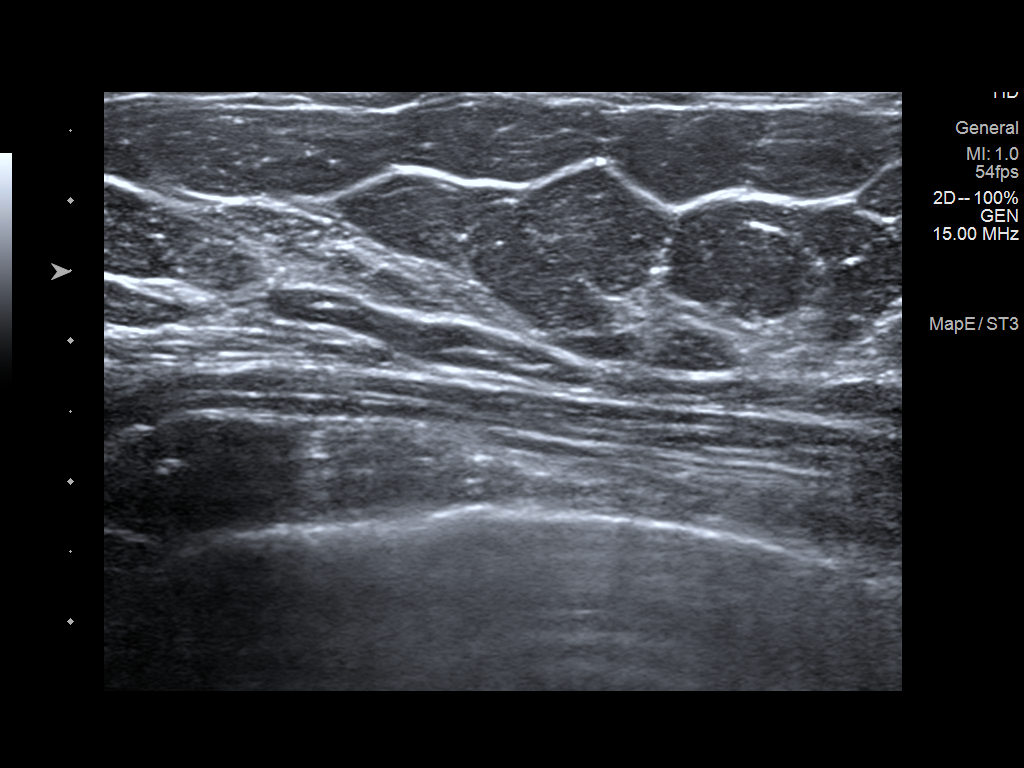
[im 4/4]
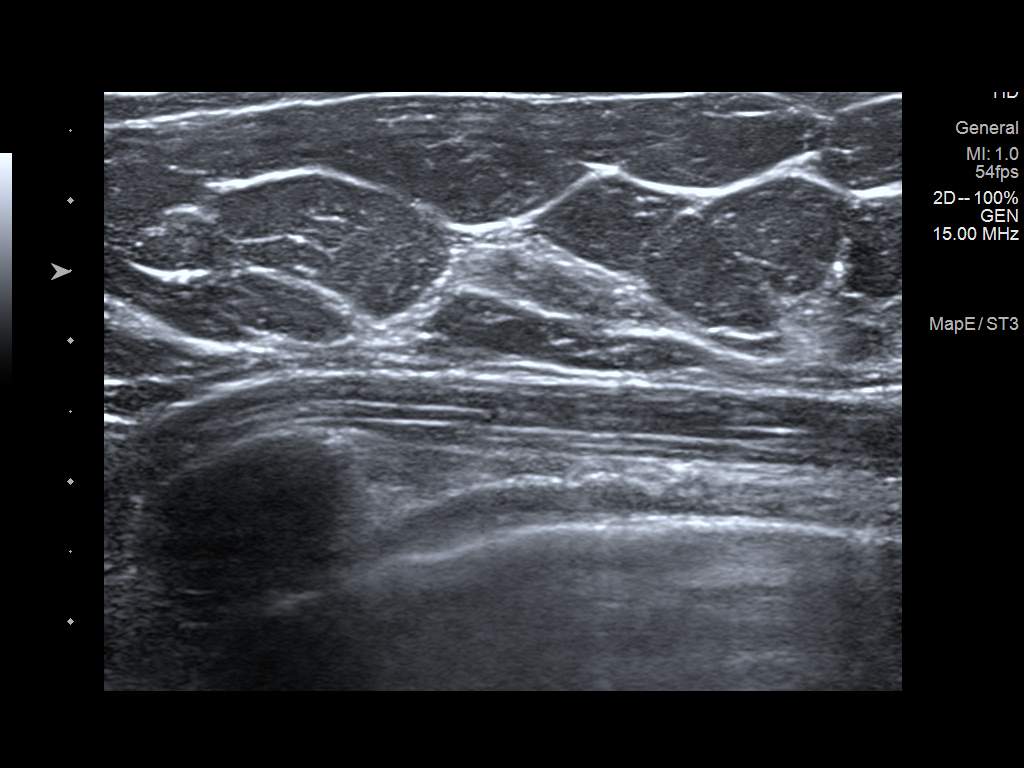

[4 of 4 positions shown; findings below may reference images not displayed]

ACR Breast Density Category b: There are scattered areas of
fibroglandular density.
FINDINGS: Full field CC and MLO views and a spot compression tangential view
of the area of focal pain were obtained.

No mammographic abnormality in the area focal pain in the UPPER
INNER QUADRANT. Normal scattered fibroglandular tissue is present in
this location.

No findings suspicious for malignancy.

Targeted ultrasound is performed in the area of focal pain,
demonstrating normal scattered fibroglandular tissue at the 10
o'clock position approximately 6 cm from nipple. The costal
cartilage for an anterior right rib is present at this location.

On correlative physical examination, there is no palpable mass at
the site of focal pain. However, the costal cartilage for the
anterior rib is palpable, and the patient describes tenderness to
palpation.
IMPRESSION: 1. No mammographic or sonographic evidence of malignancy involving
the LEFT breast.
2. The costal cartilage for an anterior LEFT rib is present in the
area of focal pain, and the patient describes tenderness to
palpation, query costochondritis.

RECOMMENDATION:
Annual BILATERAL screening mammography which is due in late [REDACTED]
or January 2021.

I have discussed the findings and recommendations with the patient.
If applicable, a reminder letter will be sent to the patient
regarding the next appointment.

BI-RADS CATEGORY  1: Negative.

## 2021-08-11 IMAGING — MG MM DIGITAL DIAGNOSTIC UNILAT*L* W/ TOMO W/ CAD
6 series · 6 of 18 positions shown · non-contrast
Comparison: Previous exam(s).

CLINICAL DATA: 64-year-old presenting with chronic intermittent
focal pain involving the UPPER INNER QUADRANT of the LEFT breast.

EXAM:
DIGITAL DIAGNOSTIC UNILATERAL LEFT MAMMOGRAM WITH TOMOSYNTHESIS AND
CAD; ULTRASOUND LEFT BREAST LIMITED
TECHNIQUE: Left digital diagnostic mammography and breast tomosynthesis was
performed. The images were evaluated with computer-aided detection.;
Targeted ultrasound examination of the left breast was performed

[L CC synth-2D]
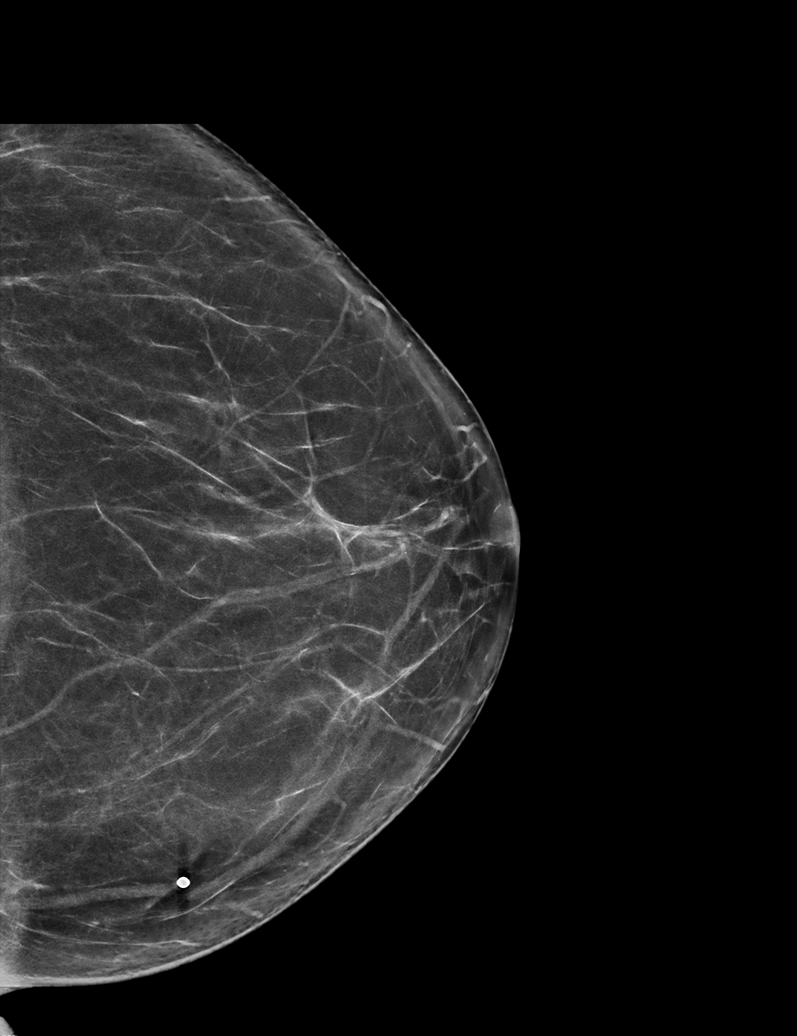

[L MLO synth-2D]
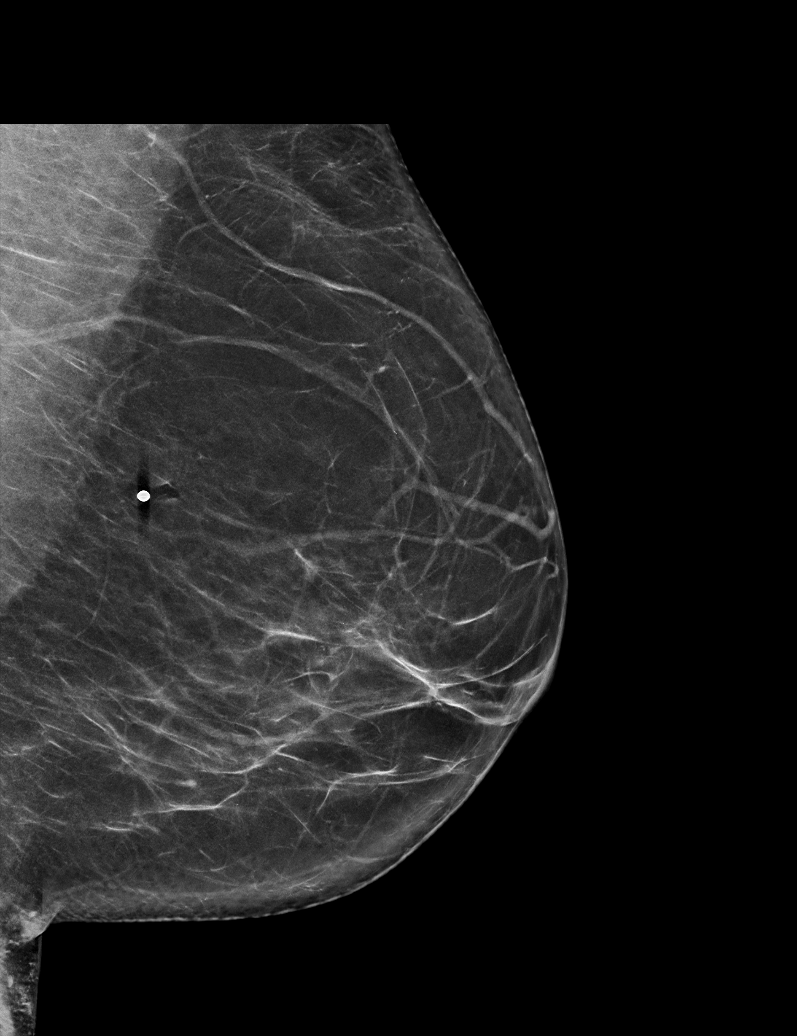

[L TAN synth-2D]
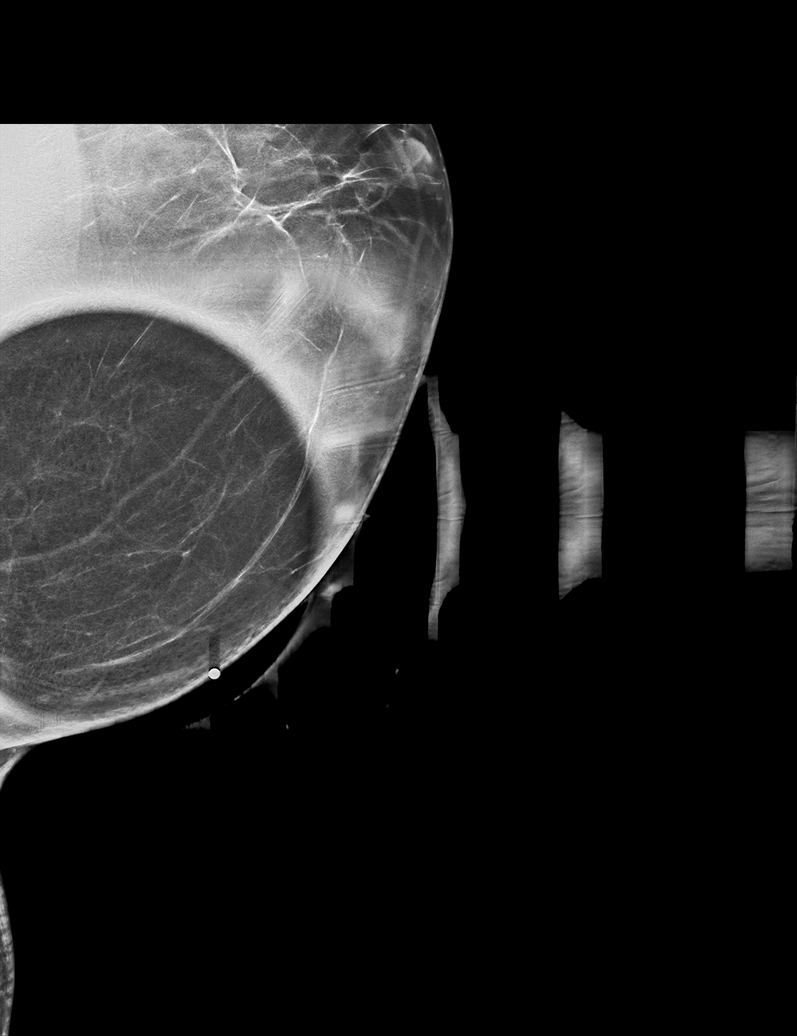

[L MLO tomo · tomo slice 35/70.0]
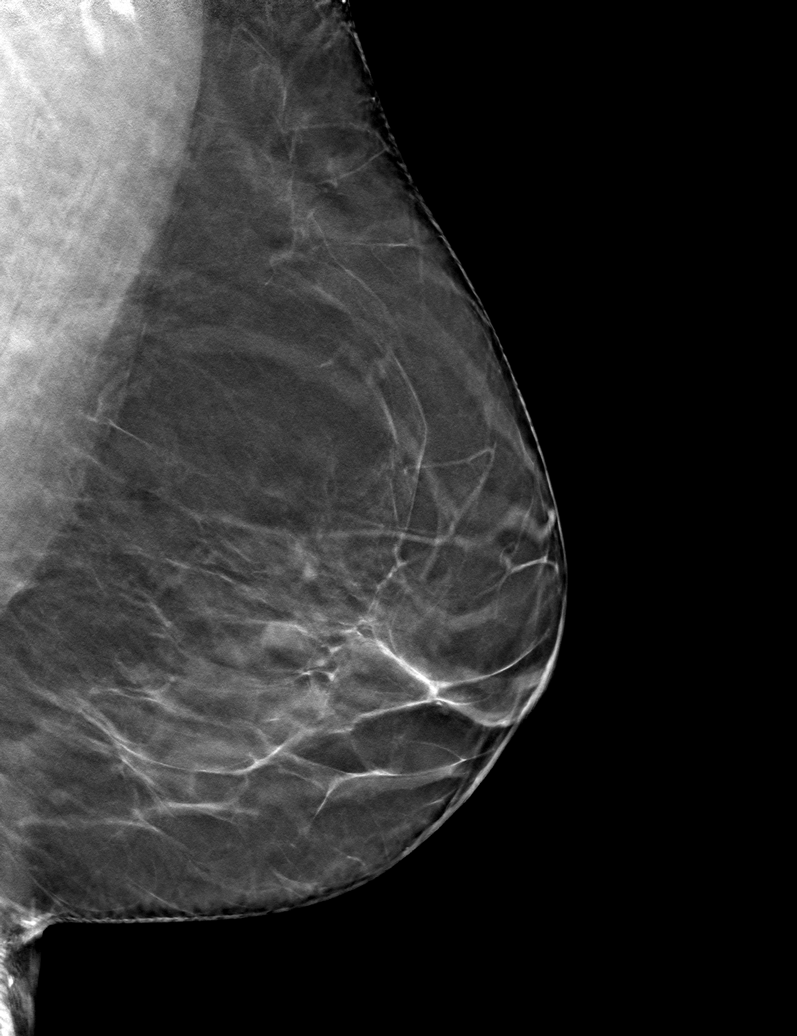

[L TAN tomo · tomo slice 31/62.0]
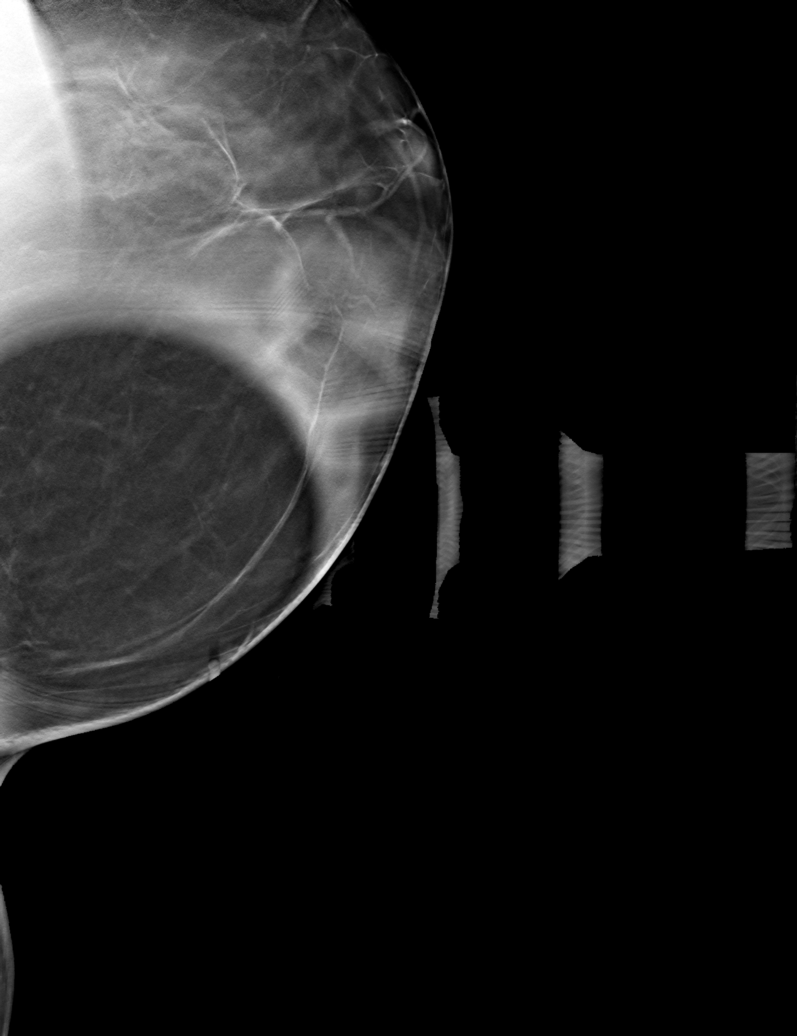

[L CC tomo · tomo slice 35/68.0]
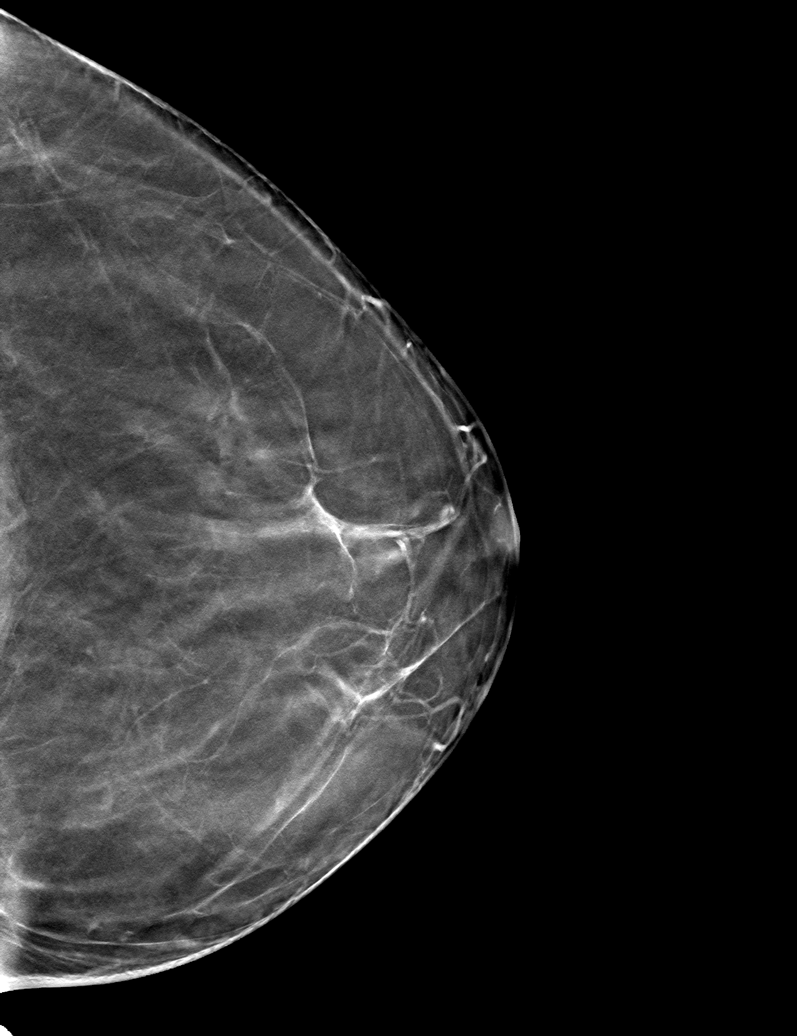

[6 of 18 positions shown; findings below may reference images not displayed]

ACR Breast Density Category b: There are scattered areas of
fibroglandular density.
FINDINGS: Full field CC and MLO views and a spot compression tangential view
of the area of focal pain were obtained.

No mammographic abnormality in the area focal pain in the UPPER
INNER QUADRANT. Normal scattered fibroglandular tissue is present in
this location.

No findings suspicious for malignancy.

Targeted ultrasound is performed in the area of focal pain,
demonstrating normal scattered fibroglandular tissue at the 10
o'clock position approximately 6 cm from nipple. The costal
cartilage for an anterior right rib is present at this location.

On correlative physical examination, there is no palpable mass at
the site of focal pain. However, the costal cartilage for the
anterior rib is palpable, and the patient describes tenderness to
palpation.
IMPRESSION: 1. No mammographic or sonographic evidence of malignancy involving
the LEFT breast.
2. The costal cartilage for an anterior LEFT rib is present in the
area of focal pain, and the patient describes tenderness to
palpation, query costochondritis.

RECOMMENDATION:
Annual BILATERAL screening mammography which is due in late [REDACTED]
or January 2021.

I have discussed the findings and recommendations with the patient.
If applicable, a reminder letter will be sent to the patient
regarding the next appointment.

BI-RADS CATEGORY  1: Negative.

## 2021-09-07 DIAGNOSIS — I10 Essential (primary) hypertension: Secondary | ICD-10-CM | POA: Diagnosis not present

## 2021-09-07 DIAGNOSIS — E663 Overweight: Secondary | ICD-10-CM | POA: Diagnosis not present

## 2021-09-07 DIAGNOSIS — Z713 Dietary counseling and surveillance: Secondary | ICD-10-CM | POA: Diagnosis not present

## 2021-11-02 DIAGNOSIS — I480 Paroxysmal atrial fibrillation: Secondary | ICD-10-CM | POA: Diagnosis not present

## 2021-11-02 DIAGNOSIS — I2510 Atherosclerotic heart disease of native coronary artery without angina pectoris: Secondary | ICD-10-CM | POA: Diagnosis not present

## 2021-11-02 DIAGNOSIS — I471 Supraventricular tachycardia: Secondary | ICD-10-CM | POA: Diagnosis not present

## 2022-01-17 ENCOUNTER — Other Ambulatory Visit: Payer: Self-pay | Admitting: Family Medicine

## 2022-01-17 DIAGNOSIS — E785 Hyperlipidemia, unspecified: Secondary | ICD-10-CM

## 2022-01-30 ENCOUNTER — Ambulatory Visit (INDEPENDENT_AMBULATORY_CARE_PROVIDER_SITE_OTHER): Payer: Medicare Other | Admitting: Family Medicine

## 2022-01-30 ENCOUNTER — Encounter: Payer: Self-pay | Admitting: Family Medicine

## 2022-01-30 VITALS — BP 125/65 | HR 68 | Ht 65.0 in | Wt 184.5 lb

## 2022-01-30 DIAGNOSIS — E7800 Pure hypercholesterolemia, unspecified: Secondary | ICD-10-CM | POA: Diagnosis not present

## 2022-01-30 DIAGNOSIS — Z0000 Encounter for general adult medical examination without abnormal findings: Secondary | ICD-10-CM

## 2022-01-30 DIAGNOSIS — N83202 Unspecified ovarian cyst, left side: Secondary | ICD-10-CM

## 2022-01-30 DIAGNOSIS — Z23 Encounter for immunization: Secondary | ICD-10-CM | POA: Diagnosis not present

## 2022-01-30 NOTE — Progress Notes (Signed)
Complete physical exam  Patient: Julia Perez   DOB: [birth year]   66 y.o. Female  MRN: [id number]  Subjective:    Chief Complaint  Patient presents with   Annual Exam    Julia Perez is a 66 y.o. female who presents today for a complete physical exam. She reports consuming a general diet. The patient does not participate in regular exercise at present. She generally feels well.  She does not have additional problems to discuss today.   Has been having a little numbness and at her just foot and one of her feet.  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  Most recent fall risk assessment:    01/30/2022    9:06 AM  Fall Risk   Falls in the past year? 0  Number falls in past yr: 0  Injury with Fall? 0  Risk for fall due to : No Fall Risks  Follow up Falls evaluation completed     Most recent depression screenings:    01/30/2022    9:06 AM 04/20/2021    8:32 AM  PHQ 2/9 Scores  PHQ - 2 Score 0 0      Past Surgical History:  Procedure Laterality Date   bladder tac  02/27/1986   Endoscopic pregnancy  02/28/1996   HERNIA REPAIR  02/27/1957   VAGINAL HYSTERECTOMY  02/28/1996   Social History   Tobacco Use   Smoking status: Never   Smokeless tobacco: Never  Vaping Use   Vaping Use: Never used  Substance Use Topics   Alcohol use: No   Drug use: No   Allergies  Allergen Reactions   Metoprolol Other (See Comments)    Stomach pain    Sulfa Antibiotics Rash and Itching      Patient Care Team: Agapito Games, MD as PCP - General (Family Medicine)   Outpatient Medications Prior to Visit  Medication Sig   atorvastatin (LIPITOR) 20 MG tablet TAKE ONE TABLET BY MOUTH DAILY   diltiazem (CARDIZEM CD) 120 MG 24 hr capsule Take 120 mg by mouth daily.   Multiple Vitamin (THERA) TABS Take 1 tablet  by mouth daily.   [DISCONTINUED] amoxicillin-clavulanate (AUGMENTIN) 875-125 MG tablet Take 1 tablet by mouth 2 (two) times daily.   [DISCONTINUED] famotidine (PEPCID) 20 MG tablet Take 10 mg by mouth daily. 10 mg daily   [DISCONTINUED] nystatin (MYCOSTATIN) 100000 UNIT/ML suspension Take 5 mLs (500,000 Units total) by mouth 4 (four) times daily. X 1 week. Swish and hold in mouth for at least 2 minutes and then swallow.   No facility-administered medications prior to visit.    ROS        Objective:     BP 125/65 (BP Location: Left Arm, Patient Position: Sitting, Cuff Size: Large)   Pulse 68   Ht 5\' 5"  (1.651 m)   Wt 184 lb 8 oz (83.7 kg)   SpO2 97%   BMI 30.70 kg/m     Physical Exam Vitals and nursing note reviewed. Exam conducted with a chaperone present.  Constitutional:      Appearance: She is well-developed.  HENT:     Head: Normocephalic and atraumatic.     Right Ear: Tympanic membrane, ear canal and external ear normal.     Left Ear: Tympanic membrane, ear canal and external  ear normal.     Nose: Nose normal.     Mouth/Throat:     Pharynx: Oropharynx is clear.  Eyes:     Conjunctiva/sclera: Conjunctivae normal.     Pupils: Pupils are equal, round, and reactive to light.  Neck:     Thyroid: No thyromegaly.  Cardiovascular:     Rate and Rhythm: Normal rate and regular rhythm.     Heart sounds: Normal heart sounds.  Pulmonary:     Effort: Pulmonary effort is normal.     Breath sounds: Normal breath sounds. No wheezing.  Chest:     Chest wall: No mass.  Breasts:    Right: Normal.     Left: Normal.  Abdominal:     General: Bowel sounds are normal.     Palpations: Abdomen is soft.  Musculoskeletal:     Cervical back: Neck supple.  Lymphadenopathy:     Cervical: No cervical adenopathy.     Upper Body:     Right upper body: No supraclavicular, axillary or pectoral adenopathy.     Left upper body: No supraclavicular, axillary or pectoral adenopathy.   Skin:    General: Skin is warm and dry.     Coloration: Skin is not pale.  Neurological:     Mental Status: She is alert and oriented to person, place, and time.  Psychiatric:        Behavior: Behavior normal.      No results found for any visits on 01/30/22.     Assessment & Plan:    Routine Health Maintenance and Physical Exam  Immunization History  Administered Date(s) Administered   Fluad Quad(high Dose 65+) 01/26/2021, 01/30/2022   Influenza Split 01/12/2020   Influenza,inj,Quad PF,6+ Mos 01/04/2017, 01/07/2018, 11/01/2018   Influenza-Unspecified 12/15/2015   Moderna Sars-Covid-2 Vaccination 05/21/2019, 06/18/2019, 01/12/2020   Pneumococcal Conjugate-13 01/13/2019   Pneumococcal Polysaccharide-23 01/26/2020   Tdap 03/02/2008, 01/07/2018   Zoster Recombinat (Shingrix) 01/04/2017, 06/28/2017   Zoster, Live 01/05/2016    Health Maintenance  Topic Date Due   COVID-19 Vaccine (4 - 2023-24 season) 10/28/2021   Medicare Annual Wellness (AWV)  02/01/2022   MAMMOGRAM  04/14/2023   COLONOSCOPY (Pts 45-42yrs Insurance coverage will need to be confirmed)  07/29/2023   DTaP/Tdap/Td (3 - Td or Tdap) 01/08/2028   Pneumonia Vaccine 31+ Years old  Completed   INFLUENZA VACCINE  Completed   DEXA SCAN  Completed   Hepatitis C Screening  Completed   Zoster Vaccines- Shingrix  Completed   HPV VACCINES  Aged Out    Discussed health benefits of physical activity, and encouraged her to engage in regular exercise appropriate for her age and condition.  Problem List Items Addressed This Visit   None Visit Diagnoses     Wellness examination    -  Primary   Relevant Orders   Lipid Panel w/reflex Direct LDL   COMPLETE METABOLIC PANEL WITH GFR   CBC   Left ovarian cyst       Relevant Orders   US Pelvic Complete With Transvaginal   Need for immunization against influenza       Relevant Orders   Flu Vaccine QUAD High Dose(Fluad) (Completed)     Keep up a regular exercise  program and make sure you are eating a healthy diet Try to eat 4 servings of dairy a day, or if you are lactose intolerant take a calcium with vitamin D daily.  Your vaccines are up to date.   Orders Placed This  Encounter  Procedures   US Pelvic Complete With Transvaginal    Standing Status:   Future    Standing Expiration Date:   01/31/2023    Order Specific Question:   Reason for Exam (SYMPTOM  OR DIAGNOSIS REQUIRED)    Answer:   Left ovarian cyst    Order Specific Question:   Preferred imaging location?    Answer:   [facility]   Flu Vaccine QUAD High Dose(Fluad)   Lipid Panel w/reflex Direct LDL   COMPLETE METABOLIC PANEL WITH GFR   CBC     Return in about 1 year (around 01/31/2023) for Wellness Exam.     Beatrice Lecher, MD

## 2022-01-31 LAB — COMPLETE METABOLIC PANEL WITH GFR
AG Ratio: 1.9 (calc) (ref 1.0–2.5)
ALT: 14 U/L (ref 6–29)
AST: 17 U/L (ref 10–35)
Albumin: 4.4 g/dL (ref 3.6–5.1)
Alkaline phosphatase (APISO): 87 U/L (ref 37–153)
BUN: 13 mg/dL (ref 7–25)
CO2: 28 mmol/L (ref 20–32)
Calcium: 9.1 mg/dL (ref 8.6–10.4)
Chloride: 105 mmol/L (ref 98–110)
Creat: 0.84 mg/dL (ref 0.50–1.05)
Globulin: 2.3 g/dL (calc) (ref 1.9–3.7)
Glucose, Bld: 91 mg/dL (ref 65–99)
Potassium: 4.1 mmol/L (ref 3.5–5.3)
Sodium: 141 mmol/L (ref 135–146)
Total Bilirubin: 0.8 mg/dL (ref 0.2–1.2)
Total Protein: 6.7 g/dL (ref 6.1–8.1)
eGFR: 76 mL/min/{1.73_m2} (ref >=60)

## 2022-01-31 LAB — LIPID PANEL W/REFLEX DIRECT LDL
Cholesterol: 165 mg/dL (ref <200)
HDL: 80 mg/dL (ref >=50)
LDL Cholesterol (Calc): 71 mg/dL (calc)
Non-HDL Cholesterol (Calc): 85 mg/dL (calc) (ref <130)
Total CHOL/HDL Ratio: 2.1 (calc) (ref <5.0)
Triglycerides: 67 mg/dL (ref <150)

## 2022-01-31 LAB — CBC
HCT: 40 % (ref 35.0–45.0)
Hemoglobin: 13.3 g/dL (ref 11.7–15.5)
MCH: 30.4 pg (ref 27.0–33.0)
MCHC: 33.3 g/dL (ref 32.0–36.0)
MCV: 91.5 fL (ref 80.0–100.0)
MPV: 10.1 fL (ref 7.5–12.5)
Platelets: 231 10*3/uL (ref 140–400)
RBC: 4.37 10*6/uL (ref 3.80–5.10)
RDW: 12 % (ref 11.0–15.0)
WBC: 4.1 10*3/uL (ref 3.8–10.8)

## 2022-02-01 NOTE — Progress Notes (Signed)
Julia Perez, cholesterol looks great!  Metabolic panel and blood count also are at goal.

## 2022-02-02 ENCOUNTER — Ambulatory Visit (INDEPENDENT_AMBULATORY_CARE_PROVIDER_SITE_OTHER): Payer: Medicare Other

## 2022-02-02 DIAGNOSIS — N83202 Unspecified ovarian cyst, left side: Secondary | ICD-10-CM

## 2022-02-02 DIAGNOSIS — Z90710 Acquired absence of both cervix and uterus: Secondary | ICD-10-CM | POA: Diagnosis not present

## 2022-02-06 ENCOUNTER — Ambulatory Visit (INDEPENDENT_AMBULATORY_CARE_PROVIDER_SITE_OTHER): Payer: Medicare Other | Admitting: Family Medicine

## 2022-02-06 DIAGNOSIS — Z780 Asymptomatic menopausal state: Secondary | ICD-10-CM | POA: Diagnosis not present

## 2022-02-06 DIAGNOSIS — Z0000 Encounter for general adult medical examination without abnormal findings: Secondary | ICD-10-CM

## 2022-02-06 NOTE — Progress Notes (Signed)
MEDICARE ANNUAL WELLNESS VISIT  02/06/2022  Telephone Visit Disclaimer This Medicare AWV was conducted by telephone due to national recommendations for restrictions regarding the COVID-19 Pandemic (e.g. social distancing).  I verified, using two identifiers, that I am speaking with Julia Perez or their authorized healthcare agent. I discussed the limitations, risks, security, and privacy concerns of performing an evaluation and management service by telephone and the potential availability of an in-person appointment in the future. The patient expressed understanding and agreed to proceed.  Location of Patient: Home Location of Provider (nurse):  In the office.  Subjective:    Julia Perez is a 66 y.o. female patient of Metheney, Barbarann Ehlers, MD who had a Medicare Annual Wellness Visit today via telephone. Julia Perez is Working part time and lives with their spouse and son. she has 6 children. she reports that she is socially active and does interact with friends/family regularly. she is moderately physically active and enjoys  quilting, sewing, knitting and yard work.   Patient Care Team: Agapito Games, MD as PCP - General (Family Medicine)     02/06/2022    2:59 PM 02/01/2021    3:08 PM 01/19/2020   10:24 AM 01/13/2019    9:36 AM 01/26/2016   10:29 AM  Advanced Directives  Does Patient Have a Medical Advance Directive? Yes Yes Yes Yes Yes  Type of Advance Directive Living will Living will;Healthcare Power of [employer] Power of [place];Living will;Out of facility DNR (pink MOST or yellow form) Healthcare Power of [place];Living will   Does patient want to make changes to medical advance directive? No - Patient declined No - Patient declined No - Patient declined No - Patient declined No - Patient declined  Copy of Healthcare Power of Attorney in Chart?  No - copy requested No - copy requested No - copy requested     Hospital Utilization Over the Past  12 Months: # of hospitalizations or ER visits: 1 # of surgeries: 0  Review of Systems    Patient reports that her overall health is unchanged compared to last year.  History obtained from chart review and the patient  Patient Reported Readings (BP, Pulse, CBG, Weight, etc) none  Pain Assessment Pain : No/denies pain     Current Medications & Allergies (verified) Allergies as of 02/06/2022       Reactions   Metoprolol Other (See Comments)   Stomach pain    Sulfa Antibiotics Rash, Itching        Medication List        Accurate as of February 06, 2022  3:12 PM. If you have any questions, ask your nurse or doctor.          aspirin EC 81 MG tablet Take 1 tablet by mouth daily.   atorvastatin 20 MG tablet Commonly known as: LIPITOR TAKE ONE TABLET BY MOUTH DAILY   diltiazem 120 MG 24 hr capsule Commonly known as: CARDIZEM CD Take 120 mg by mouth daily.   Thera Tabs Take 1 tablet by mouth daily.        History (reviewed): Past Medical History:  Diagnosis Date   Anxiety 1974   Arthritis 1990   GERD (gastroesophageal reflux disease) Last 6 months   Caused by medication   Hyperlipidemia Last 3 years   Just started taking a statin   Palpitations    Past Surgical History:  Procedure Laterality Date   bladder tac  02/27/1986   Endoscopic pregnancy  02/28/1996  HERNIA REPAIR  02/27/1957   VAGINAL HYSTERECTOMY  02/28/1996   Family History  Problem Relation Age of Onset   Breast cancer Mother 1455       Postmenopausal   Diabetes Mellitus II Mother    Anxiety disorder Mother    Arthritis Mother    Cancer Mother    Diabetes Mother    Miscarriages / Stillbirths Mother    Heart attack Father [id number]   Early death Father    Diabetes Daughter    Social History   Socioeconomic History   Marital status: Married    Spouse name: Elige RadonBradley   Number of children: 6   Years of education: 16   Highest education level: Bachelor's degree (e.g., BA, AB, BS)   Occupational History   Occupation: hairstylist  Tobacco Use   Smoking status: Never   Smokeless tobacco: Never  Vaping Use   Vaping Use: Never used  Substance and Sexual Activity   Alcohol use: Never   Drug use: No   Sexual activity: Not Currently    Partners: Male    Birth control/protection: Post-menopausal  Other Topics Concern   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 She is married to Estell ManorBradley with 6 children. Her son is moving back in and is also currently primary caretaker. She enjoys quilting, sewing, knitting and yard work.    Social Determinants of Health   Financial Resource Strain: Low Risk  (02/05/2022)   Overall Financial Resource Strain (CARDIA)    Difficulty of Paying Living Expenses: Not hard at all  Food Insecurity: No Food Insecurity (02/05/2022)   Hunger Vital Sign    Worried About Running Out of Food in the Last Year: Never true    Ran Out of Food in the Last Year: Never true  Transportation Needs: No Transportation Needs (02/05/2022)   PRAPARE - Administrator, Civil ServiceTransportation    Lack of Transportation (Medical): No    Lack of Transportation (Non-Medical): No  Physical Activity: Sufficiently Active (02/05/2022)   Exercise Vital Sign    Days of Exercise per Week: 6 days    Minutes of Exercise per Session: 60 min  Stress: Stress Concern Present (02/05/2022)   Julia Perez    Feeling of Stress : To some extent  Social Connections: Socially Integrated (02/06/2022)   Social Connection and Isolation Panel [NHANES]    Frequency of Communication with Friends and Family: More than three times a week    Frequency of Social Gatherings with Friends and Family: More than three times a week    Attends Religious Services: More than 4 times per year    Active Member of [employer] or Organizations: Yes    Attends BankerClub or Organization Meetings: More than 4  times per year    Marital Status: Married    Activities of Daily Living    02/05/2022    3:32 PM  In your present state of health, do you have any difficulty performing the following activities:  Hearing? 0  Vision? 0  Difficulty concentrating or making decisions? 0  Walking or climbing stairs? 0  Dressing or bathing? 0  Doing errands, shopping? 0  Preparing Food and eating ? N  Using the Toilet? N  In the past six months, have you accidently leaked urine? N  Do you have problems with loss of bowel control? N  Managing your Medications? N  Managing your Finances? N  Housekeeping  or managing your Housekeeping? N    Patient Education/ Literacy How often do you need to have someone help you when you read instructions, pamphlets, or other written materials from your doctor or pharmacy?: 1 - Never What is the last grade level you completed in school?: bachelor's degree  Exercise Current Exercise Habits: Home exercise routine, Type of exercise: walking, Time (Minutes): 60, Frequency (Times/Week): 6, Weekly Exercise (Minutes/Week): 360, Intensity: Moderate, Exercise limited by: None identified  Diet Patient reports consuming 3 meals a day and 1 snack(s) a day Patient reports that her primary diet is: Regular Patient reports that she does have regular access to food.   Depression Screen    02/06/2022    3:01 PM 01/30/2022    9:06 AM 04/20/2021    8:32 AM 02/01/2021    3:05 PM 01/26/2021    9:12 AM 01/26/2020    9:35 AM 01/19/2020   10:24 AM  PHQ 2/9 Scores  PHQ - 2 Score 0 0 0 0 0 0 0     Fall Risk    02/06/2022    3:02 PM 02/05/2022    3:32 PM 01/30/2022    9:06 AM 04/20/2021    8:32 AM 02/01/2021    3:05 PM  Fall Risk   Falls in the past year? 1 1 0 1 0  Number falls in past yr: 0 0 0 0 0  Injury with Fall? 1 1 0 1 0  Risk for fall due to : History of fall(s)  No Fall Risks Other (Comment) No Fall Risks  Risk for fall due to: Comment    tripped and fell   Follow up  Falls evaluation completed;Education provided;Falls prevention discussed  Falls evaluation completed Falls prevention discussed;Falls evaluation completed Falls evaluation completed     Objective:  Julia Perez seemed alert and oriented and she participated appropriately during our telephone visit.  Blood Pressure Weight BMI  BP Readings from Last 3 Encounters:  01/30/22 125/65  04/20/21 134/70  01/26/21 124/60   Wt Readings from Last 3 Encounters:  01/30/22 184 lb 8 oz (83.7 kg)  04/20/21 178 lb (80.7 kg)  01/26/21 181 lb (82.1 kg)   BMI Readings from Last 1 Encounters:  01/30/22 30.70 kg/m    *Unable to obtain current vital signs, weight, and BMI due to telephone visit type  Hearing/Vision  Julia Perez did not seem to have difficulty with hearing/understanding during the telephone conversation Reports that she has had a formal eye exam by an eye care professional within the past year Reports that she has not had a formal hearing evaluation within the past year *Unable to fully assess hearing and vision during telephone visit type  Cognitive Function:    02/06/2022    3:03 PM 02/01/2021    3:15 PM 01/19/2020   10:25 AM 01/13/2019    9:44 AM  6CIT Screen  What Year? 0 points 0 points 0 points 0 points  What month? 0 points 0 points 0 points 0 points  What time? 0 points 0 points 0 points 0 points  Count back from 20 0 points 0 points 0 points 0 points  Months in reverse 0 points  0 points 0 points  Repeat phrase 0 points  0 points 0 points  Total Score 0 points  0 points 0 points   (Normal:0-7, Significant for Dysfunction: >8)  Normal Cognitive Function Screening: Yes   Immunization & Health Maintenance Record Immunization History  Administered Date(s) Administered   Fluad  Quad(high Dose 65+) 01/26/2021, 01/30/2022   Influenza Split 01/12/2020   Influenza,inj,Quad PF,6+ Mos 01/04/2017, 01/07/2018, 11/01/2018   Influenza-Unspecified 12/15/2015   Moderna  Sars-Covid-2 Vaccination 05/21/2019, 06/18/2019, 01/12/2020   Pneumococcal Conjugate-13 01/13/2019   Pneumococcal Polysaccharide-23 01/26/2020   Tdap 03/02/2008, 01/07/2018   Zoster Recombinat (Shingrix) 01/04/2017, 06/28/2017   Zoster, Live 01/05/2016    Health Maintenance  Topic Date Due   COVID-19 Vaccine (4 - 2023-24 season) 02/22/2022 (Originally 10/28/2021)   Medicare Annual Wellness (AWV)  02/07/2023   MAMMOGRAM  04/14/2023   COLONOSCOPY (Pts 45-40yrs Insurance coverage will need to be confirmed)  07/29/2023   DTaP/Tdap/Td (3 - Td or Tdap) 01/08/2028   Pneumonia Vaccine 58+ Years old  Completed   INFLUENZA VACCINE  Completed   DEXA SCAN  Completed   Hepatitis C Screening  Completed   Zoster Vaccines- Shingrix  Completed   HPV VACCINES  Aged Out       Assessment  This is a routine wellness examination for [employer] Julia Perez.  Health Maintenance: Due or Overdue There are no preventive care reminders to display for this patient.   Julia Perez does not need a referral for Community Assistance: Care Management:   no Social Work:    no Prescription Assistance:  no Nutrition/Diabetes Education:  no   Plan:  Personalized Goals  Goals Addressed               This Visit's Progress     Patient Stated (pt-stated)        Patient stated she would like to loose 25 lbs.       Personalized Health Maintenance & Screening Recommendations  Bone densitometry screening - due in March.   Lung Cancer Screening Recommended: no (Low Dose CT Chest recommended if Age 7-80 years, 30 pack-year currently smoking OR have quit w/in past 15 years) Hepatitis C Screening recommended: no HIV Screening recommended: no  Advanced Directives: Written information was not prepared per patient's request.  Referrals & Orders Orders Placed This Encounter  Procedures   DEXAScan    Follow-up Plan Follow-up with Agapito Games, MD as planned Medicare wellness visit in one  year.  Patient will access AVS on my chart.   I have personally reviewed and noted the following in the patient's chart:   Medical and social history Use of alcohol, tobacco or illicit drugs  Current medications and supplements Functional ability and status Nutritional status Physical activity Advanced directives List of other physicians Hospitalizations, surgeries, and ER visits in previous 12 months Vitals Screenings to include cognitive, depression, and falls Referrals and appointments  In addition, I have reviewed and discussed with Julia Perez certain preventive protocols, quality metrics, and best practice recommendations. A written personalized care plan for preventive services as well as general preventive health recommendations is available and can be mailed to the patient at her request.      Modesto Charon, RN BSN  02/06/2022

## 2022-02-06 NOTE — Patient Instructions (Signed)
MEDICARE ANNUAL WELLNESS VISIT Health Maintenance Summary and Written Plan of Care  Ms. Kooi ,  Thank you for allowing me to perform your Medicare Annual Wellness Visit and for your ongoing commitment to your health.   Health Maintenance & Immunization History Health Maintenance  Topic Date Due   COVID-19 Vaccine (4 - 2023-24 season) 02/22/2022 (Originally 10/28/2021)   Medicare Annual Wellness (AWV)  02/07/2023   MAMMOGRAM  04/14/2023   COLONOSCOPY (Pts 45-59yrs Insurance coverage will need to be confirmed)  07/29/2023   DTaP/Tdap/Td (3 - Td or Tdap) 01/08/2028   Pneumonia Vaccine 7+ Years old  Completed   INFLUENZA VACCINE  Completed   DEXA SCAN  Completed   Hepatitis C Screening  Completed   Zoster Vaccines- Shingrix  Completed   HPV VACCINES  Aged Out   Immunization History  Administered Date(s) Administered   Fluad Quad(high Dose 65+) 01/26/2021, 01/30/2022   Influenza Split 01/12/2020   Influenza,inj,Quad PF,6+ Mos 01/04/2017, 01/07/2018, 11/01/2018   Influenza-Unspecified 12/15/2015   Moderna Sars-Covid-2 Vaccination 05/21/2019, 06/18/2019, 01/12/2020   Pneumococcal Conjugate-13 01/13/2019   Pneumococcal Polysaccharide-23 01/26/2020   Tdap 03/02/2008, 01/07/2018   Zoster Recombinat (Shingrix) 01/04/2017, 06/28/2017   Zoster, Live 01/05/2016    These are the patient goals that we discussed:  Goals Addressed               This Visit's Progress     Patient Stated (pt-stated)        Patient stated she would like to loose 25 lbs.         This is a list of Health Maintenance Items that are overdue or due now: Bone densitometry screening - due in March.     Orders/Referrals Placed Today: No orders of the defined types were placed in this encounter.  (Contact our referral department at [phone] if you have not spoken with someone about your referral appointment within the next 5 days)    Follow-up Plan Follow-up with Agapito Games, MD as  planned Medicare wellness visit in one year.  Patient will access AVS on my chart.      Health Maintenance, Female Adopting a healthy lifestyle and getting preventive care are important in promoting health and wellness. Ask your health care provider about: The right schedule for you to have regular tests and exams. Things you can do on your own to prevent diseases and keep yourself healthy. What should I know about diet, weight, and exercise? Eat a healthy diet  Eat a diet that includes plenty of vegetables, fruits, low-fat dairy products, and lean protein. Do not eat a lot of foods that are high in solid fats, added sugars, or sodium. Maintain a healthy weight Body mass index (BMI) is used to identify weight problems. It estimates body fat based on height and weight. Your health care provider can help determine your BMI and help you achieve or maintain a healthy weight. Get regular exercise Get regular exercise. This is one of the most important things you can do for your health. Most adults should: Exercise for at least 150 minutes each week. The exercise should increase your heart rate and make you sweat (moderate-intensity exercise). Do strengthening exercises at least twice a week. This is in addition to the moderate-intensity exercise. Spend less time sitting. Even light physical activity can be beneficial. Watch cholesterol and blood lipids Have your blood tested for lipids and cholesterol at 68 years of age, then have this test every 5 years. Have your cholesterol levels  checked more often if: Your lipid or cholesterol levels are high. You are older than 68 years of age. You are at high risk for heart disease. What should I know about cancer screening? Depending on your health history and family history, you may need to have cancer screening at various ages. This may include screening for: Breast cancer. Cervical cancer. Colorectal cancer. Skin cancer. Lung cancer. What  should I know about heart disease, diabetes, and high blood pressure? Blood pressure and heart disease High blood pressure causes heart disease and increases the risk of stroke. This is more likely to develop in people who have high blood pressure readings or are overweight. Have your blood pressure checked: Every 3-5 years if you are 30-32 years of age. Every year if you are 74 years old or older. Diabetes Have regular diabetes screenings. This checks your fasting blood sugar level. Have the screening done: Once every three years after age 16 if you are at a normal weight and have a low risk for diabetes. More often and at a younger age if you are overweight or have a high risk for diabetes. What should I know about preventing infection? Hepatitis B If you have a higher risk for hepatitis B, you should be screened for this virus. Talk with your health care provider to find out if you are at risk for hepatitis B infection. Hepatitis C Testing is recommended for: Everyone born from [birth year] through 1967. Anyone with known risk factors for hepatitis C. Sexually transmitted infections (STIs) Get screened for STIs, including gonorrhea and chlamydia, if: You are sexually active and are younger than 68 years of age. You are older than 68 years of age and your health care provider tells you that you are at risk for this type of infection. Your sexual activity has changed since you were last screened, and you are at increased risk for chlamydia or gonorrhea. Ask your health care provider if you are at risk. Ask your health care provider about whether you are at high risk for HIV. Your health care provider may recommend a prescription medicine to help prevent HIV infection. If you choose to take medicine to prevent HIV, you should first get tested for HIV. You should then be tested every 3 months for as long as you are taking the medicine. Pregnancy If you are about to stop having your period  (premenopausal) and you may become pregnant, seek counseling before you get pregnant. Take 400 to 800 micrograms (mcg) of folic acid every day if you become pregnant. Ask for birth control (contraception) if you want to prevent pregnancy. Osteoporosis and menopause Osteoporosis is a disease in which the bones lose minerals and strength with aging. This can result in bone fractures. If you are 30 years old or older, or if you are at risk for osteoporosis and fractures, ask your health care provider if you should: Be screened for bone loss. Take a calcium or vitamin D supplement to lower your risk of fractures. Be given hormone replacement therapy (HRT) to treat symptoms of menopause. Follow these instructions at home: Alcohol use Do not drink alcohol if: Your health care provider tells you not to drink. You are pregnant, may be pregnant, or are planning to become pregnant. If you drink alcohol: Limit how much you have to: 0-1 drink a day. Know how much alcohol is in your drink. In the [place] one drink equals one 12 oz bottle of beer (355 mL), one 5 oz glass of wine (  148 mL), or one 1 oz glass of hard liquor (44 mL). Lifestyle Do not use any products that contain nicotine or tobacco. These products include cigarettes, chewing tobacco, and vaping devices, such as e-cigarettes. If you need help quitting, ask your health care provider. Do not use street drugs. Do not share needles. Ask your health care provider for help if you need support or information about quitting drugs. General instructions Schedule regular health, dental, and eye exams. Stay current with your vaccines. Tell your health care provider if: You often feel depressed. You have ever been abused or do not feel safe at home. Summary Adopting a healthy lifestyle and getting preventive care are important in promoting health and wellness. Follow your health care provider's instructions about healthy diet, exercising, and getting  tested or screened for diseases. Follow your health care provider's instructions on monitoring your cholesterol and blood pressure. This information is not intended to replace advice given to you by your health care provider. Make sure you discuss any questions you have with your health care provider. Document Revised: 07/05/2020 Document Reviewed: 07/05/2020 Elsevier Patient Education  2023 [employer].

## 2022-02-06 NOTE — Progress Notes (Signed)
Hi Julia Perez, the simple cyst on the left ovary is measuring less than 3 cm.  So it is stable in size so they recommend no further follow-up at this point in time.

## 2022-03-01 ENCOUNTER — Ambulatory Visit (INDEPENDENT_AMBULATORY_CARE_PROVIDER_SITE_OTHER): Payer: BLUE CROSS/BLUE SHIELD

## 2022-03-01 ENCOUNTER — Encounter: Payer: Self-pay | Admitting: Family Medicine

## 2022-03-01 DIAGNOSIS — Z780 Asymptomatic menopausal state: Secondary | ICD-10-CM | POA: Diagnosis not present

## 2022-03-01 DIAGNOSIS — Z0000 Encounter for general adult medical examination without abnormal findings: Secondary | ICD-10-CM | POA: Diagnosis not present

## 2022-03-01 DIAGNOSIS — M8580 Other specified disorders of bone density and structure, unspecified site: Secondary | ICD-10-CM | POA: Diagnosis not present

## 2022-03-01 DIAGNOSIS — M8589 Other specified disorders of bone density and structure, multiple sites: Secondary | ICD-10-CM | POA: Diagnosis not present

## 2022-03-01 NOTE — Progress Notes (Signed)
Your bone density shows T score of -2.3.  this is in the range of mildly think bones called osteopenia.     The current recommendation for osteopenia (mildly thin bones) treatment includes:   #1 calcium-total of 1200 mg of calcium daily.  If you eat a very calcium rich diet you may be able to obtain that without a supplement.  If not, then I recommend calcium 500 mg twice a day.  There are several products over-the-counter such as Caltrate D and Viactiv chews which are great options that contain calcium and vitamin D. #2 vitamin D-recommend 800 international units daily. #3 exercise-recommend 30 minutes of weightbearing exercise 3 days a week.  Resistance training ,such as doing bands and light weights, can be particularly helpful.

## 2022-03-07 DIAGNOSIS — K080 Exfoliation of teeth due to systemic causes: Secondary | ICD-10-CM | POA: Diagnosis not present

## 2022-03-08 ENCOUNTER — Other Ambulatory Visit: Payer: Self-pay | Admitting: Family Medicine

## 2022-03-08 DIAGNOSIS — Z1231 Encounter for screening mammogram for malignant neoplasm of breast: Secondary | ICD-10-CM

## 2022-03-14 DIAGNOSIS — I480 Paroxysmal atrial fibrillation: Secondary | ICD-10-CM | POA: Diagnosis not present

## 2022-03-14 DIAGNOSIS — R040 Epistaxis: Secondary | ICD-10-CM | POA: Diagnosis not present

## 2022-03-14 DIAGNOSIS — R0789 Other chest pain: Secondary | ICD-10-CM | POA: Diagnosis not present

## 2022-03-14 DIAGNOSIS — R002 Palpitations: Secondary | ICD-10-CM | POA: Diagnosis not present

## 2022-04-19 ENCOUNTER — Ambulatory Visit (INDEPENDENT_AMBULATORY_CARE_PROVIDER_SITE_OTHER): Payer: Medicare Other

## 2022-04-19 DIAGNOSIS — Z1231 Encounter for screening mammogram for malignant neoplasm of breast: Secondary | ICD-10-CM | POA: Diagnosis not present

## 2022-04-24 ENCOUNTER — Telehealth (INDEPENDENT_AMBULATORY_CARE_PROVIDER_SITE_OTHER): Payer: Medicare Other | Admitting: Family Medicine

## 2022-04-24 ENCOUNTER — Encounter: Payer: Self-pay | Admitting: Family Medicine

## 2022-04-24 DIAGNOSIS — U071 COVID-19: Secondary | ICD-10-CM

## 2022-04-24 MED ORDER — NIRMATRELVIR/RITONAVIR (PAXLOVID)TABLET: 3.0000 | ORAL_TABLET | Freq: Two times a day (BID) | ORAL | 0 refills | Status: AC

## 2022-04-24 NOTE — Progress Notes (Signed)
Pt reports that her sxs began Friday. She has had cough,congestion. She took two covid tests over the weekend they were negative. She took another one today it was positive.   She has been taking flonase, and tylenol. The flonase didn't help her at all.   Her highest temp was 100 that was earlier today she stated that it has come down to 98.9 she hasn't taken any tylenol today.

## 2022-04-24 NOTE — Progress Notes (Signed)
    Virtual Visit via Video Note  I connected with Julia Perez on 04/24/22 at  3:20 PM EST by a video enabled telemedicine application and verified that I am speaking with the correct person using two identifiers.   I discussed the limitations of evaluation and management by telemedicine and the availability of in person appointments. The patient expressed understanding and agreed to proceed.  Patient location: at home Provider location: in office  Subjective:    CC:   Chief Complaint  Patient presents with   Covid Positive    HPI:  Pt reports that her sxs began Friday. She has had cough,congestion. She took two covid tests over the weekend they were negative. She took another one today it was positive.  Has been was positive for COVID earlier last week.   She has been taking flonase, and tylenol. The flonase didn't help her at all.    Her highest temp was 100 that was earlier today she stated that it has come down to 98.9 she hasn't taken any tylenol today   Past medical history, Surgical history, Family history not pertinant except as noted below, Social history, Allergies, and medications have been entered into the medical record, reviewed, and corrections made.    Objective:    General: Speaking clearly in complete sentences without any shortness of breath.  Alert and oriented x3.  Normal judgment. No apparent acute distress.    Impression and Recommendations:    Problem List Items Addressed This Visit   None Visit Diagnoses     COVID-19    -  Primary   Relevant Medications   nirmatrelvir/ritonavir (PAXLOVID) 20 x 150 MG & 10 x 100MG TABS      The 19-discussed options.  Will treat with Paxlovid.  We discussed quarantine guidelines.  This is actually the first time she is ever had COVID.  Okay to continue symptomatic care.  Call if not better in 1 week.  No orders of the defined types were placed in this encounter.   Meds ordered this encounter   Medications   nirmatrelvir/ritonavir (PAXLOVID) 20 x 150 MG & 10 x 100MG TABS    Sig: Take 3 tablets by mouth 2 (two) times daily for 5 days. (Take nirmatrelvir 150 mg two tablets twice daily for 5 days and ritonavir 100 mg one tablet twice daily for 5 days) Patient GFR is 76    Dispense:  30 tablet    Refill:  0     I discussed the assessment and treatment plan with the patient. The patient was provided an opportunity to ask questions and all were answered. The patient agreed with the plan and demonstrated an understanding of the instructions.   The patient was advised to call back or seek an in-person evaluation if the symptoms worsen or if the condition fails to improve as anticipated.   Beatrice Lecher, MD

## 2022-04-24 NOTE — Progress Notes (Signed)
Please call patient. Normal mammogram.  Repeat in 1 year.  

## 2022-06-02 DIAGNOSIS — R040 Epistaxis: Secondary | ICD-10-CM | POA: Diagnosis not present

## 2022-06-08 ENCOUNTER — Ambulatory Visit (INDEPENDENT_AMBULATORY_CARE_PROVIDER_SITE_OTHER): Payer: Medicare Other | Admitting: Sports Medicine

## 2022-06-08 ENCOUNTER — Ambulatory Visit (INDEPENDENT_AMBULATORY_CARE_PROVIDER_SITE_OTHER): Payer: Medicare Other

## 2022-06-08 ENCOUNTER — Encounter: Payer: Self-pay | Admitting: Sports Medicine

## 2022-06-08 DIAGNOSIS — M25562 Pain in left knee: Secondary | ICD-10-CM | POA: Diagnosis not present

## 2022-06-08 DIAGNOSIS — M75122 Complete rotator cuff tear or rupture of left shoulder, not specified as traumatic: Secondary | ICD-10-CM

## 2022-06-08 DIAGNOSIS — M1712 Unilateral primary osteoarthritis, left knee: Secondary | ICD-10-CM | POA: Diagnosis not present

## 2022-06-08 DIAGNOSIS — M75102 Unspecified rotator cuff tear or rupture of left shoulder, not specified as traumatic: Secondary | ICD-10-CM | POA: Insufficient documentation

## 2022-06-08 DIAGNOSIS — M170 Bilateral primary osteoarthritis of knee: Secondary | ICD-10-CM

## 2022-06-08 DIAGNOSIS — M1711 Unilateral primary osteoarthritis, right knee: Secondary | ICD-10-CM | POA: Diagnosis not present

## 2022-06-08 DIAGNOSIS — M25561 Pain in right knee: Secondary | ICD-10-CM | POA: Diagnosis not present

## 2022-06-08 DIAGNOSIS — M19012 Primary osteoarthritis, left shoulder: Secondary | ICD-10-CM | POA: Diagnosis not present

## 2022-06-08 MED ORDER — ACETAMINOPHEN ER 650 MG PO TBCR: 650.0000 mg | EXTENDED_RELEASE_TABLET | Freq: Three times a day (TID) | ORAL | 3 refills | Status: DC | PRN

## 2022-06-08 NOTE — Assessment & Plan Note (Signed)
Left knee continues to do well after injection approximately 3 years ago. Right knee started to hurt medial joint line, positive McMurray's sign, otherwise stable. Unable to use NSAIDs. Starting arthritis strength Tylenol, updated x-rays, home physical therapy. Will consider injection and custom bracing at follow-up if needed.

## 2022-06-08 NOTE — Progress Notes (Signed)
    Procedures performed today:    None.  Independent interpretation of notes and tests performed by another provider:   None.  Brief History, Exam, Impression, and Recommendations:    Rotator cuff tear, left 67 year old female, left shoulder pain since November, on exam she has significant weakness to abduction, positive drop arm sign, positive Neer's, Hawkins, good passive range of motion. Suspect complete tear of the supraspinatus. X-rays, MRI, she does have an appointment with Dr. Theophilus Bones coming up on Monday.  Primary osteoarthritis of both knees Left knee continues to do well after injection approximately 3 years ago. Right knee started to hurt medial joint line, positive McMurray's sign, otherwise stable. Unable to use NSAIDs. Starting arthritis strength Tylenol, updated x-rays, home physical therapy. Will consider injection and custom bracing at follow-up if needed.    ____________________________________________ Ihor Austin. Benjamin Stain, M.D., ABFM., CAQSM., AME. Primary Care and Sports Medicine [facility]  Adjunct Professor of Family Medicine  [employer] [facility]  Restaurant manager, fast food

## 2022-06-08 NOTE — Assessment & Plan Note (Signed)
67 year old female, left shoulder pain since November, on exam she has significant weakness to abduction, positive drop arm sign, positive Neer's, Hawkins, good passive range of motion. Suspect complete tear of the supraspinatus. X-rays, MRI, she does have an appointment with Dr. Theophilus Bones coming up on Monday.

## 2022-06-10 ENCOUNTER — Ambulatory Visit (INDEPENDENT_AMBULATORY_CARE_PROVIDER_SITE_OTHER): Payer: Medicare Other

## 2022-06-10 DIAGNOSIS — M75122 Complete rotator cuff tear or rupture of left shoulder, not specified as traumatic: Secondary | ICD-10-CM | POA: Diagnosis not present

## 2022-06-10 DIAGNOSIS — S46012A Strain of muscle(s) and tendon(s) of the rotator cuff of left shoulder, initial encounter: Secondary | ICD-10-CM | POA: Diagnosis not present

## 2022-06-12 DIAGNOSIS — M25512 Pain in left shoulder: Secondary | ICD-10-CM | POA: Diagnosis not present

## 2022-06-28 DIAGNOSIS — M25512 Pain in left shoulder: Secondary | ICD-10-CM | POA: Diagnosis not present

## 2022-06-29 DIAGNOSIS — M65812 Other synovitis and tenosynovitis, left shoulder: Secondary | ICD-10-CM | POA: Diagnosis not present

## 2022-06-29 DIAGNOSIS — M7502 Adhesive capsulitis of left shoulder: Secondary | ICD-10-CM | POA: Diagnosis not present

## 2022-06-29 DIAGNOSIS — M75112 Incomplete rotator cuff tear or rupture of left shoulder, not specified as traumatic: Secondary | ICD-10-CM | POA: Diagnosis not present

## 2022-06-29 DIAGNOSIS — M94212 Chondromalacia, left shoulder: Secondary | ICD-10-CM | POA: Diagnosis not present

## 2022-06-29 DIAGNOSIS — M7542 Impingement syndrome of left shoulder: Secondary | ICD-10-CM | POA: Diagnosis not present

## 2022-06-29 DIAGNOSIS — M75122 Complete rotator cuff tear or rupture of left shoulder, not specified as traumatic: Secondary | ICD-10-CM | POA: Diagnosis not present

## 2022-06-29 DIAGNOSIS — M66822 Spontaneous rupture of other tendons, left upper arm: Secondary | ICD-10-CM | POA: Diagnosis not present

## 2022-06-29 DIAGNOSIS — M66322 Spontaneous rupture of flexor tendons, left upper arm: Secondary | ICD-10-CM | POA: Diagnosis not present

## 2022-06-29 DIAGNOSIS — G8918 Other acute postprocedural pain: Secondary | ICD-10-CM | POA: Diagnosis not present

## 2022-06-29 DIAGNOSIS — M19012 Primary osteoarthritis, left shoulder: Secondary | ICD-10-CM | POA: Diagnosis not present

## 2022-06-29 DIAGNOSIS — M24112 Other articular cartilage disorders, left shoulder: Secondary | ICD-10-CM | POA: Diagnosis not present

## 2022-07-04 DIAGNOSIS — R6889 Other general symptoms and signs: Secondary | ICD-10-CM | POA: Diagnosis not present

## 2022-07-04 DIAGNOSIS — M25512 Pain in left shoulder: Secondary | ICD-10-CM | POA: Diagnosis not present

## 2022-07-11 DIAGNOSIS — M25512 Pain in left shoulder: Secondary | ICD-10-CM | POA: Diagnosis not present

## 2022-07-11 DIAGNOSIS — R6889 Other general symptoms and signs: Secondary | ICD-10-CM | POA: Diagnosis not present

## 2022-07-18 DIAGNOSIS — M25512 Pain in left shoulder: Secondary | ICD-10-CM | POA: Diagnosis not present

## 2022-07-18 DIAGNOSIS — R6889 Other general symptoms and signs: Secondary | ICD-10-CM | POA: Diagnosis not present

## 2022-07-20 ENCOUNTER — Ambulatory Visit (INDEPENDENT_AMBULATORY_CARE_PROVIDER_SITE_OTHER): Payer: Medicare Other | Admitting: Sports Medicine

## 2022-07-20 DIAGNOSIS — M75122 Complete rotator cuff tear or rupture of left shoulder, not specified as traumatic: Secondary | ICD-10-CM | POA: Diagnosis not present

## 2022-07-20 DIAGNOSIS — M170 Bilateral primary osteoarthritis of knee: Secondary | ICD-10-CM

## 2022-07-20 NOTE — Assessment & Plan Note (Signed)
Right knee pain resolved with Tylenol, home therapy, return as needed. Left knee continues to do well after injection 3 years ago.

## 2022-07-20 NOTE — Progress Notes (Signed)
    Procedures performed today:    None.  Independent interpretation of notes and tests performed by another provider:   None.  Brief History, Exam, Impression, and Recommendations:    Rotator cuff tear, left Full-thickness retracted spinatus tear, she has already had cuff repair with Dr. Theophilus Bones. Further management with him. She is having some difficulty sleeping due to neck pain and the need to sleep supine, happy to add some Neurontin as needed to help her sleep.  Primary osteoarthritis of both knees Right knee pain resolved with Tylenol, home therapy, return as needed. Left knee continues to do well after injection 3 years ago.    ____________________________________________ Ihor Austin. Benjamin Stain, M.D., ABFM., CAQSM., AME. Primary Care and Sports Medicine [facility]  Adjunct Professor of Family Medicine  [employer] [facility] [employer]  Restaurant manager, fast food

## 2022-07-20 NOTE — Assessment & Plan Note (Signed)
Full-thickness retracted spinatus tear, she has already had cuff repair with Dr. Theophilus Bones. Further management with him. She is having some difficulty sleeping due to neck pain and the need to sleep supine, happy to add some Neurontin as needed to help her sleep.

## 2022-07-26 DIAGNOSIS — R6889 Other general symptoms and signs: Secondary | ICD-10-CM | POA: Diagnosis not present

## 2022-07-26 DIAGNOSIS — M25512 Pain in left shoulder: Secondary | ICD-10-CM | POA: Diagnosis not present

## 2022-08-02 DIAGNOSIS — R6889 Other general symptoms and signs: Secondary | ICD-10-CM | POA: Diagnosis not present

## 2022-08-02 DIAGNOSIS — M25512 Pain in left shoulder: Secondary | ICD-10-CM | POA: Diagnosis not present

## 2022-08-09 DIAGNOSIS — M25512 Pain in left shoulder: Secondary | ICD-10-CM | POA: Diagnosis not present

## 2022-08-09 DIAGNOSIS — R6889 Other general symptoms and signs: Secondary | ICD-10-CM | POA: Diagnosis not present

## 2022-08-16 DIAGNOSIS — R6889 Other general symptoms and signs: Secondary | ICD-10-CM | POA: Diagnosis not present

## 2022-08-16 DIAGNOSIS — M25512 Pain in left shoulder: Secondary | ICD-10-CM | POA: Diagnosis not present

## 2022-08-17 ENCOUNTER — Ambulatory Visit (INDEPENDENT_AMBULATORY_CARE_PROVIDER_SITE_OTHER): Payer: Medicare Other | Admitting: Family Medicine

## 2022-08-17 ENCOUNTER — Encounter: Payer: Self-pay | Admitting: Family Medicine

## 2022-08-17 VITALS — BP 118/73 | HR 79 | Ht 65.0 in | Wt 185.0 lb

## 2022-08-17 DIAGNOSIS — L03115 Cellulitis of right lower limb: Secondary | ICD-10-CM

## 2022-08-17 DIAGNOSIS — L0390 Cellulitis, unspecified: Secondary | ICD-10-CM | POA: Insufficient documentation

## 2022-08-17 MED ORDER — DOXYCYCLINE HYCLATE 100 MG PO TABS
100.0000 mg | ORAL_TABLET | Freq: Two times a day (BID) | ORAL | 0 refills | Status: DC
Start: 2022-08-17 — End: 2023-02-01

## 2022-08-17 NOTE — Progress Notes (Signed)
Julia Perez - 67 y.o. female MRN [id number]  Date of birth: September 25, 1955  Subjective Chief Complaint  Patient presents with   Rash    HPI Julia Perez is a 67 y.o. female here today with complaint of rash on the back of the R leg. Noticed yesterday.  Concerned because she thought the rash potentially looked like a bullseye rash. Rash has not really been itchy or painful.  She does not recall any insect bites or stings.  Denies new medications or skin products.  She has been using her pool some recently.  She denies fever, chills or flu like symptoms.  She has had a couple of red patches on her abdomen as well.   ROS:  A comprehensive ROS was completed and negative except as noted per HPI  Allergies  Allergen Reactions   Metoprolol Other (See Comments)    Stomach pain    Sulfa Antibiotics Rash and Itching    Past Medical History:  Diagnosis Date   Anxiety 1974   Arthritis 1990   GERD (gastroesophageal reflux disease) Last 6 months   Caused by medication   Hyperlipidemia Last 3 years   Just started taking a statin   Palpitations     Past Surgical History:  Procedure Laterality Date   bladder tac  02/27/1986   Endoscopic pregnancy  02/28/1996   HERNIA REPAIR  02/27/1957   VAGINAL HYSTERECTOMY  02/28/1996    Social History   Socioeconomic History   Marital status: Married    Spouse name: [name]   Number of children: 6   Years of education: 16   Highest education level: Bachelor's degree (e.g., BA, AB, BS)  Occupational History   Occupation: hairstylist  Tobacco Use   Smoking status: Never   Smokeless tobacco: Never  Vaping Use   Vaping Use: Never used  Substance and Sexual Activity   Alcohol use: Never   Drug use: No   Sexual activity: Not Currently    Partners: Male    Birth control/protection: Post-menopausal  Other Topics Concern   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  She is married to Marcus with 6 children. Her son is moving back in and is also currently primary caretaker. She enjoys quilting, sewing, knitting and yard work.    Social Determinants of Health   Financial Resource Strain: Low Risk  (06/08/2022)   Overall Financial Resource Strain (CARDIA)    Difficulty of Paying Living Expenses: Not hard at all  Food Insecurity: No Food Insecurity (06/08/2022)   Hunger Vital Sign    Worried About Running Out of Food in the Last Year: Never true    Ran Out of Food in the Last Year: Never true  Transportation Needs: No Transportation Needs (06/08/2022)   PRAPARE - Administrator, Civil Service (Medical): No    Lack of Transportation (Non-Medical): No  Physical Activity: Sufficiently Active (06/08/2022)   Exercise Vital Sign    Days of Exercise per Week: 5 days    Minutes of Exercise per Session: 40 min  Stress: No Stress Concern Present (06/08/2022)   [employer] of Occupational Health - Occupational Stress Questionnaire    Feeling of Stress : Only a little  Social Connections: Socially Integrated (06/08/2022)   Social Connection and Isolation Panel [NHANES]    Frequency of Communication with Friends and Family: More than three times a week    Frequency of Social Gatherings  with Friends and Family: More than three times a week    Attends Religious Services: More than 4 times per year    Active Member of Clubs or Organizations: Yes    Attends Engineer, structural: More than 4 times per year    Marital Status: Married    Family History  Problem Relation Age of Onset   Breast cancer Mother 19       Postmenopausal   Diabetes Mellitus II Mother    Anxiety disorder Mother    Arthritis Mother    Cancer Mother    Diabetes Mother    Miscarriages / Stillbirths Mother    Heart attack Father 16   Early death Father    Diabetes Daughter     Health Maintenance  Topic Date Due   COVID-19 Vaccine (4 - 2023-24 season) 05/10/2023  (Originally 10/28/2021)   INFLUENZA VACCINE  09/28/2022   Medicare Annual Wellness (AWV)  03/02/2023   Colonoscopy  07/29/2023   MAMMOGRAM  04/19/2024   DTaP/Tdap/Td (3 - Td or Tdap) 01/08/2028   Pneumonia Vaccine 49+ Years old  Completed   DEXA SCAN  Completed   Hepatitis C Screening  Completed   Zoster Vaccines- Shingrix  Completed   HPV VACCINES  Aged Out     ----------------------------------------------------------------------------------------------------------------------------------------------------------------------------------------------------------------- Physical Exam BP 118/73 (BP Location: Left Arm, Patient Position: Sitting, Cuff Size: Large)   Pulse 79   Ht 5\' 5"  (1.651 m)   Wt 185 lb (83.9 kg)   SpO2 98%   BMI 30.79 kg/m   Physical Exam Constitutional:      Appearance: Normal appearance.  HENT:     Head: Normocephalic and atraumatic.  Eyes:     General: No scleral icterus. Skin:    Comments: Erythematous patch along the R posterior thigh that extends in a backwards L shaped pattern.  No swelling or induration noted.  Warm to touch.    Small patch of erythema on the abdominal well.   Neurological:     Mental Status: She is alert.  Psychiatric:        Mood and Affect: Mood normal.        Behavior: Behavior normal.     ------------------------------------------------------------------------------------------------------------------------------------------------------------------------------------------------------------------- Assessment and Plan  Cellulitis Appears she may have insect bite/sting initially.  Doesn't really have appearance of erythema migrans.  Will cover for cellulitis with doxycycline which would also cover potential tick borne illness.  Red flag symptoms discussed, instructed to contact clinic if having worsening.    Meds ordered this encounter  Medications   doxycycline (VIBRA-TABS) 100 MG tablet    Sig: Take 1 tablet (100 mg  total) by mouth 2 (two) times daily.    Dispense:  20 tablet    Refill:  0    No follow-ups on file.    This visit occurred during the SARS-CoV-2 public health emergency.  Safety protocols were in place, including screening questions prior to the visit, additional usage of staff PPE, and extensive cleaning of exam room while observing appropriate contact time as indicated for disinfecting solutions.

## 2022-08-17 NOTE — Assessment & Plan Note (Signed)
Appears she may have insect bite/sting initially.  Doesn't really have appearance of erythema migrans.  Will cover for cellulitis with doxycycline which would also cover potential tick borne illness.  Red flag symptoms discussed, instructed to contact clinic if having worsening.

## 2022-08-17 NOTE — Patient Instructions (Signed)
Start doxycycline.  If developing new/worsening symptoms let us know.

## 2022-08-23 DIAGNOSIS — R6889 Other general symptoms and signs: Secondary | ICD-10-CM | POA: Diagnosis not present

## 2022-08-23 DIAGNOSIS — M25512 Pain in left shoulder: Secondary | ICD-10-CM | POA: Diagnosis not present

## 2022-08-30 DIAGNOSIS — M25512 Pain in left shoulder: Secondary | ICD-10-CM | POA: Diagnosis not present

## 2022-08-30 DIAGNOSIS — R6889 Other general symptoms and signs: Secondary | ICD-10-CM | POA: Diagnosis not present

## 2022-09-04 DIAGNOSIS — L538 Other specified erythematous conditions: Secondary | ICD-10-CM | POA: Diagnosis not present

## 2022-09-04 DIAGNOSIS — L309 Dermatitis, unspecified: Secondary | ICD-10-CM | POA: Diagnosis not present

## 2022-09-04 DIAGNOSIS — Z789 Other specified health status: Secondary | ICD-10-CM | POA: Diagnosis not present

## 2022-09-04 DIAGNOSIS — D225 Melanocytic nevi of trunk: Secondary | ICD-10-CM | POA: Diagnosis not present

## 2022-09-04 DIAGNOSIS — L298 Other pruritus: Secondary | ICD-10-CM | POA: Diagnosis not present

## 2022-09-04 DIAGNOSIS — L821 Other seborrheic keratosis: Secondary | ICD-10-CM | POA: Diagnosis not present

## 2022-09-04 DIAGNOSIS — L820 Inflamed seborrheic keratosis: Secondary | ICD-10-CM | POA: Diagnosis not present

## 2022-09-06 DIAGNOSIS — M25512 Pain in left shoulder: Secondary | ICD-10-CM | POA: Diagnosis not present

## 2022-09-06 DIAGNOSIS — R6889 Other general symptoms and signs: Secondary | ICD-10-CM | POA: Diagnosis not present

## 2022-09-13 DIAGNOSIS — R6889 Other general symptoms and signs: Secondary | ICD-10-CM | POA: Diagnosis not present

## 2022-09-13 DIAGNOSIS — M25512 Pain in left shoulder: Secondary | ICD-10-CM | POA: Diagnosis not present

## 2022-09-20 DIAGNOSIS — M25512 Pain in left shoulder: Secondary | ICD-10-CM | POA: Diagnosis not present

## 2022-09-20 DIAGNOSIS — R6889 Other general symptoms and signs: Secondary | ICD-10-CM | POA: Diagnosis not present

## 2022-10-25 DIAGNOSIS — M25512 Pain in left shoulder: Secondary | ICD-10-CM | POA: Diagnosis not present

## 2022-11-01 DIAGNOSIS — I2510 Atherosclerotic heart disease of native coronary artery without angina pectoris: Secondary | ICD-10-CM | POA: Diagnosis not present

## 2022-11-01 DIAGNOSIS — I480 Paroxysmal atrial fibrillation: Secondary | ICD-10-CM | POA: Diagnosis not present

## 2022-11-01 DIAGNOSIS — I471 Supraventricular tachycardia, unspecified: Secondary | ICD-10-CM | POA: Diagnosis not present

## 2022-11-01 DIAGNOSIS — I4719 Other supraventricular tachycardia: Secondary | ICD-10-CM | POA: Diagnosis not present

## 2022-11-01 DIAGNOSIS — Z8241 Family history of sudden cardiac death: Secondary | ICD-10-CM | POA: Diagnosis not present

## 2022-11-01 DIAGNOSIS — K080 Exfoliation of teeth due to systemic causes: Secondary | ICD-10-CM | POA: Diagnosis not present

## 2022-12-06 DIAGNOSIS — M25512 Pain in left shoulder: Secondary | ICD-10-CM | POA: Diagnosis not present

## 2022-12-21 DIAGNOSIS — I480 Paroxysmal atrial fibrillation: Secondary | ICD-10-CM | POA: Diagnosis not present

## 2022-12-21 DIAGNOSIS — Z8241 Family history of sudden cardiac death: Secondary | ICD-10-CM | POA: Diagnosis not present

## 2022-12-21 DIAGNOSIS — I471 Supraventricular tachycardia, unspecified: Secondary | ICD-10-CM | POA: Diagnosis not present

## 2022-12-21 DIAGNOSIS — I4719 Other supraventricular tachycardia: Secondary | ICD-10-CM | POA: Diagnosis not present

## 2022-12-26 DIAGNOSIS — K080 Exfoliation of teeth due to systemic causes: Secondary | ICD-10-CM | POA: Diagnosis not present

## 2023-01-07 ENCOUNTER — Other Ambulatory Visit: Payer: Self-pay | Admitting: Family Medicine

## 2023-01-07 DIAGNOSIS — E785 Hyperlipidemia, unspecified: Secondary | ICD-10-CM

## 2023-01-09 DIAGNOSIS — L570 Actinic keratosis: Secondary | ICD-10-CM | POA: Diagnosis not present

## 2023-01-09 DIAGNOSIS — L448 Other specified papulosquamous disorders: Secondary | ICD-10-CM | POA: Diagnosis not present

## 2023-01-09 DIAGNOSIS — L821 Other seborrheic keratosis: Secondary | ICD-10-CM | POA: Diagnosis not present

## 2023-01-09 DIAGNOSIS — L814 Other melanin hyperpigmentation: Secondary | ICD-10-CM | POA: Diagnosis not present

## 2023-02-01 ENCOUNTER — Ambulatory Visit (INDEPENDENT_AMBULATORY_CARE_PROVIDER_SITE_OTHER): Payer: Medicare Other | Admitting: Family Medicine

## 2023-02-01 ENCOUNTER — Encounter: Payer: Self-pay | Admitting: Family Medicine

## 2023-02-01 VITALS — BP 134/82 | HR 64 | Resp 12 | Ht 65.0 in | Wt 188.1 lb

## 2023-02-01 DIAGNOSIS — I471 Supraventricular tachycardia, unspecified: Secondary | ICD-10-CM | POA: Diagnosis not present

## 2023-02-01 DIAGNOSIS — Z23 Encounter for immunization: Secondary | ICD-10-CM | POA: Diagnosis not present

## 2023-02-01 DIAGNOSIS — E785 Hyperlipidemia, unspecified: Secondary | ICD-10-CM | POA: Diagnosis not present

## 2023-02-01 DIAGNOSIS — Z0000 Encounter for general adult medical examination without abnormal findings: Secondary | ICD-10-CM

## 2023-02-01 DIAGNOSIS — M8580 Other specified disorders of bone density and structure, unspecified site: Secondary | ICD-10-CM | POA: Diagnosis not present

## 2023-02-01 DIAGNOSIS — H9203 Otalgia, bilateral: Secondary | ICD-10-CM

## 2023-02-01 NOTE — Progress Notes (Addendum)
Complete physical exam  Patient: Julia Perez   DOB: November 23, 1955   67 y.o. Female  MRN: [id number]  Subjective:    Chief Complaint  Patient presents with  . Annual Exam    Julia Perez is a 67 y.o. female who presents today for a complete physical exam. She reports consuming a general diet.  Has been walking with neighbor 4 to 5 days a week.  Her goal is to get up to 6 days a week.  She generally feels well. . She does not have additional problems to discuss today.   C/op of bilateral ear pain on and off x 6 months.  Mostly notices it when she lays down at night.  She said she had this happen years ago and was told that it was a little fluid in her ear and was given a steroid shot.  No fevers or chills or congestion no hearing loss.  Had a little discomfort in the lateral side of her neck behind her ear going down on that left side.  See her dermatologist recently they did freeze a spot on her face.   Most recent fall risk assessment:    04/24/2022    1:34 PM  Fall Risk   Falls in the past year? 0  Number falls in past yr: 0  Injury with Fall? 0  Risk for fall due to : No Fall Risks  Follow up Falls evaluation completed     Most recent depression screenings:    02/06/2022    3:01 PM 01/30/2022    9:06 AM  PHQ 2/9 Scores  PHQ - 2 Score 0 0        Patient Care Team: Agapito Games, MD as PCP - General (Family Medicine)   Outpatient Medications Prior to Visit  Medication Sig  . aspirin EC 81 MG tablet Take 1 tablet by mouth daily.  Marland Kitchen atorvastatin (LIPITOR) 20 MG tablet TAKE 1 TABLET BY MOUTH DAILY  . diltiazem (CARDIZEM CD) 120 MG 24 hr capsule Take 120 mg by mouth daily.  . Multiple Vitamin (THERA) TABS Take 1 tablet by mouth daily.  . [DISCONTINUED] acetaminophen (TYLENOL) 650 MG CR tablet Take 1 tablet (650 mg total) by mouth every 8 (eight) hours as needed for pain.  . [DISCONTINUED] doxycycline (VIBRA-TABS) 100 MG tablet Take 1 tablet (100 mg  total) by mouth 2 (two) times daily.   No facility-administered medications prior to visit.    ROS        Objective:     BP 134/82   Pulse 64   Resp 12   Ht 5\' 5"  (1.651 m)   Wt 188 lb 1.3 oz (85.3 kg)   SpO2 100%   BMI 31.30 kg/m    Physical Exam Constitutional:      Appearance: Normal appearance.  HENT:     Head: Normocephalic and atraumatic.     Right Ear: Tympanic membrane, ear canal and external ear normal.     Left Ear: Tympanic membrane, ear canal and external ear normal.     Nose: Nose normal.     Mouth/Throat:     Pharynx: Oropharynx is clear.  Eyes:     Extraocular Movements: Extraocular movements intact.     Conjunctiva/sclera: Conjunctivae normal.     Pupils: Pupils are equal, round, and reactive to light.  Neck:     Thyroid: No thyromegaly.  Cardiovascular:     Rate and Rhythm: Normal rate and regular rhythm.  Pulmonary:  Effort: Pulmonary effort is normal.     Breath sounds: Normal breath sounds.  Abdominal:     General: Bowel sounds are normal.     Palpations: Abdomen is soft.     Tenderness: There is no abdominal tenderness.  Musculoskeletal:        General: No swelling.     Cervical back: Neck supple.  Skin:    General: Skin is warm and dry.  Neurological:     Mental Status: She is oriented to person, place, and time.  Psychiatric:        Mood and Affect: Mood normal.        Behavior: Behavior normal.     No results found for any visits on 02/01/23.     Assessment & Plan:    Routine Health Maintenance and Physical Exam  Keep up a regular exercise program and make sure you are eating a healthy diet Try to eat 4 servings of dairy a day, or if you are lactose intolerant take a calcium with vitamin D daily.  Your vaccines are up to date.  Mammogram is up today.  Given flu vaccine and Prevnar 20 today. Will get up-to-date labs.  Immunization History  Administered Date(s) Administered  . Fluad Quad(high Dose 65+) 01/26/2021,  01/30/2022  . Fluad Trivalent(High Dose 65+) 02/01/2023  . Influenza Split 01/12/2020  . Influenza,inj,Quad PF,6+ Mos 01/04/2017, 01/07/2018, 11/01/2018  . Influenza-Unspecified 12/15/2015  . Moderna Sars-Covid-2 Vaccination 05/21/2019, 06/18/2019, 01/12/2020  . Pneumococcal Conjugate-13 01/13/2019  . Pneumococcal Polysaccharide-23 01/26/2020  . Tdap 03/02/2008, 01/07/2018  . Zoster Recombinant(Shingrix) 01/04/2017, 06/28/2017  . Zoster, Live 01/05/2016    Health Maintenance  Topic Date Due  . COVID-19 Vaccine (4 - 2023-24 season) 10/29/2022  . Medicare Annual Wellness (AWV)  03/02/2023  . Colonoscopy  07/29/2023  . MAMMOGRAM  04/19/2024  . DTaP/Tdap/Td (3 - Td or Tdap) 01/08/2028  . Pneumonia Vaccine 73+ Years old  Completed  . INFLUENZA VACCINE  Completed  . DEXA SCAN  Completed  . Hepatitis C Screening  Completed  . Zoster Vaccines- Shingrix  Completed  . HPV VACCINES  Aged Out    Discussed health benefits of physical activity, and encouraged her to engage in regular exercise appropriate for her age and condition.  Problem List Items Addressed This Visit       Cardiovascular and Mediastinum   Paroxysmal supraventricular tachycardia (HCC)   Relevant Orders   CMP14+EGFR   Lipid panel   CBC   VITAMIN D 25 Hydroxy (Vit-D Deficiency, Fractures)     Musculoskeletal and Integument   Osteopenia   Relevant Orders   CMP14+EGFR   Lipid panel   CBC   VITAMIN D 25 Hydroxy (Vit-D Deficiency, Fractures)     Other   Hyperlipidemia   Relevant Orders   CMP14+EGFR   Lipid panel   CBC   VITAMIN D 25 Hydroxy (Vit-D Deficiency, Fractures)   Other Visit Diagnoses     Wellness examination    -  Primary   Encounter for immunization       Relevant Orders   Flu Vaccine Trivalent High Dose (Fluad) (Completed)   Otalgia of both ears           Keep up a regular exercise program and make sure you are eating a healthy diet Try to eat 4 servings of dairy a day, or if you are  lactose intolerant take a calcium with vitamin D daily.  Your vaccines are up to date.   Ear  pain-we discussed using a nasal steroid spray for a couple of weeks to see if this is helpful if not then we could consider a round of prednisone but I did not see an effusion behind the TMs.  Return in about 1 year (around 02/01/2024) for Wellness Exam.     Nani Gasser, MD

## 2023-02-02 ENCOUNTER — Encounter: Payer: Self-pay | Admitting: Family Medicine

## 2023-02-02 DIAGNOSIS — E559 Vitamin D deficiency, unspecified: Secondary | ICD-10-CM | POA: Insufficient documentation

## 2023-02-02 LAB — CBC
Hematocrit: 40.8 % (ref 34.0–46.6)
Hemoglobin: 13.3 g/dL (ref 11.1–15.9)
MCH: 30 pg (ref 26.6–33.0)
MCHC: 32.6 g/dL (ref 31.5–35.7)
MCV: 92 fL (ref 79–97)
Platelets: 230 10*3/uL (ref 150–450)
RBC: 4.43 x10E6/uL (ref 3.77–5.28)
RDW: 12.3 % (ref 11.7–15.4)
WBC: 5.3 10*3/uL (ref 3.4–10.8)

## 2023-02-02 LAB — CMP14+EGFR
ALT: 17 [IU]/L (ref 0–32)
AST: 19 [IU]/L (ref 0–40)
Albumin: 4.4 g/dL (ref 3.9–4.9)
Alkaline Phosphatase: 107 [IU]/L (ref 44–121)
BUN/Creatinine Ratio: 22 (ref 12–28)
BUN: 16 mg/dL (ref 8–27)
Bilirubin Total: 0.6 mg/dL (ref 0.0–1.2)
CO2: 24 mmol/L (ref 20–29)
Calcium: 9 mg/dL (ref 8.7–10.3)
Chloride: 104 mmol/L (ref 96–106)
Creatinine, Ser: 0.74 mg/dL (ref 0.57–1.00)
Globulin, Total: 2 g/dL (ref 1.5–4.5)
Glucose: 81 mg/dL (ref 70–99)
Potassium: 3.9 mmol/L (ref 3.5–5.2)
Sodium: 143 mmol/L (ref 134–144)
Total Protein: 6.4 g/dL (ref 6.0–8.5)
eGFR: 88 mL/min/{1.73_m2} (ref >59)

## 2023-02-02 LAB — LIPID PANEL
Chol/HDL Ratio: 2.4 {ratio} (ref 0.0–4.4)
Cholesterol, Total: 173 mg/dL (ref 100–199)
HDL: 73 mg/dL (ref >39)
LDL Chol Calc (NIH): 85 mg/dL (ref 0–99)
Triglycerides: 82 mg/dL (ref 0–149)
VLDL Cholesterol Cal: 15 mg/dL (ref 5–40)

## 2023-02-02 LAB — VITAMIN D 25 HYDROXY (VIT D DEFICIENCY, FRACTURES): Vit D, 25-Hydroxy: 26.9 ng/mL — ABNORMAL LOW (ref 30.0–100.0)

## 2023-02-02 NOTE — Progress Notes (Signed)
Julia Perez, vitamin D level is low.  For your bone health I would recommend taking 25 mcg daily if you are not already on a vitamin D.  Your metabolic panel including liver and kidney looks great.  Cholesterol is at goal.  Blood count is normal no sign of anemia.

## 2023-02-12 ENCOUNTER — Encounter: Payer: Self-pay | Admitting: Family Medicine

## 2023-02-12 ENCOUNTER — Ambulatory Visit (INDEPENDENT_AMBULATORY_CARE_PROVIDER_SITE_OTHER): Payer: Medicare Other | Admitting: Family Medicine

## 2023-02-12 VITALS — Ht 65.0 in | Wt 185.0 lb

## 2023-02-12 DIAGNOSIS — Z0000 Encounter for general adult medical examination without abnormal findings: Secondary | ICD-10-CM | POA: Diagnosis not present

## 2023-02-12 NOTE — Progress Notes (Signed)
Subjective:   Julia Perez is a 66 y.o. female who presents for Medicare Annual (Subsequent) preventive examination.  Visit Complete: Virtual I connected with  Victorino Dike L Shevlin on 02/12/23 by a audio enabled telemedicine application and verified that I am speaking with the correct person using two identifiers.  Patient Location: Home  Provider Location: Office/Clinic  I discussed the limitations of evaluation and management by telemedicine. The patient expressed understanding and agreed to proceed.  Vital Signs: Because this visit was a virtual/telehealth visit, some criteria may be missing or patient reported. Any vitals not documented were not able to be obtained and vitals that have been documented are patient reported.  Patient Medicare AWV questionnaire was completed by the patient on n/a ; I have confirmed that all information answered by patient is correct and no changes since this date.  Cardiac Risk Factors include: advanced age (>84men, >65 women);dyslipidemia     Objective:    Today's Vitals   02/12/23 1451  Weight: 185 lb (83.9 kg)  Height: 5\' 5"  (1.651 m)   Body mass index is 30.79 kg/m.     02/12/2023    3:00 PM 02/06/2022    2:59 PM 02/01/2021    3:08 PM 01/19/2020   10:24 AM 01/13/2019    9:36 AM 01/26/2016   10:29 AM  Advanced Directives  Does Patient Have a Medical Advance Directive? Yes Yes Yes Yes Yes Yes  Type of Estate agent of [place];Living will Living will Living will;Healthcare Power of [employer] Power of [place];Living will;Out of facility DNR (pink MOST or yellow form) Healthcare Power of [place];Living will   Does patient want to make changes to medical advance directive? No - Patient declined No - Patient declined No - Patient declined No - Patient declined No - Patient declined No - Patient declined  Copy of Healthcare Power of Attorney in Chart?   No - copy requested No - copy requested No - copy  requested     Current Medications (verified) Outpatient Encounter Medications as of 02/12/2023  Medication Sig   aspirin EC 81 MG tablet Take 1 tablet by mouth daily.   atorvastatin (LIPITOR) 20 MG tablet TAKE 1 TABLET BY MOUTH DAILY   Cyanocobalamin (B-12) 5000 MCG CAPS Take by mouth.   diltiazem (CARDIZEM CD) 120 MG 24 hr capsule Take 120 mg by mouth daily.   Magnesium 400 MG CAPS Take by mouth.   Multiple Vitamin (THERA) TABS Take 1 tablet by mouth daily.   Vitamin D, Cholecalciferol, 25 MCG (1000 UT) CAPS Take by mouth.   No facility-administered encounter medications on file as of 02/12/2023.    Allergies (verified) Metoprolol and Sulfa antibiotics   History: Past Medical History:  Diagnosis Date   Anxiety 1974   Arthritis 1990   GERD (gastroesophageal reflux disease) Last 6 months   Caused by medication   Hyperlipidemia Last 3 years   Just started taking a statin   Palpitations    Past Surgical History:  Procedure Laterality Date   bladder tac  02/27/1986   Endoscopic pregnancy  02/28/1996   HERNIA REPAIR  02/27/1957   VAGINAL HYSTERECTOMY  02/28/1996   Family History  Problem Relation Age of Onset   Breast cancer Mother 3       Postmenopausal   Diabetes Mellitus II Mother    Anxiety disorder Mother    Arthritis Mother    Cancer Mother    Diabetes Mother    Miscarriages / [place] Mother  Heart attack Father 98   Early death Father    Diabetes Daughter    Social History   Socioeconomic History   Marital status: Married    Spouse name: [name]   Number of children: 6   Years of education: 16   Highest education level: Bachelor's degree (e.g., BA, AB, BS)  Occupational History   Occupation: hairstylist  Tobacco Use   Smoking status: Never   Smokeless tobacco: Never  Vaping Use   Vaping status: Never Used  Substance and Sexual Activity   Alcohol use: Never   Drug use: No   Sexual activity: Not Currently    Partners: Male    Birth  control/protection: Post-menopausal  Other Topics Concern   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 She is married to [place] with 6 children. Her son is moving back in and is also currently primary caretaker. She enjoys quilting, sewing, knitting and yard work.    Social Drivers of Corporate investment banker Strain: Low Risk  (02/12/2023)   Overall Financial Resource Strain (CARDIA)    Difficulty of Paying Living Expenses: Not hard at all  Food Insecurity: No Food Insecurity (02/12/2023)   Hunger Vital Sign    Worried About Running Out of Food in the Last Year: Never true    Ran Out of Food in the Last Year: Never true  Transportation Needs: No Transportation Needs (02/12/2023)   PRAPARE - Administrator, Civil Service (Medical): No    Lack of Transportation (Non-Medical): No  Physical Activity: Sufficiently Active (02/12/2023)   Exercise Vital Sign    Days of Exercise per Week: 5 days    Minutes of Exercise per Session: 30 min  Stress: No Stress Concern Present (02/12/2023)   [employer] of Occupational Health - Occupational Stress Questionnaire    Feeling of Stress : Only a little  Social Connections: Socially Integrated (02/12/2023)   Social Connection and Isolation Panel [NHANES]    Frequency of Communication with Friends and Family: More than three times a week    Frequency of Social Gatherings with Friends and Family: More than three times a week    Attends Religious Services: More than 4 times per year    Active Member of [employer] or Organizations: Yes    Attends Engineer, structural: More than 4 times per year    Marital Status: Married    Tobacco Counseling Counseling given: Not Answered   Clinical Intake:  Pre-visit preparation completed: No  Pain : No/denies pain     BMI - recorded: 30.8 Nutritional Status: BMI > 30  Obese Nutritional Risks: None Diabetes:  No  How often do you need to have someone help you when you read instructions, pamphlets, or other written materials from your doctor or pharmacy?: 1 - Never What is the last grade level you completed in school?: 16  Interpreter Needed?: No      Activities of Daily Living    02/12/2023    2:52 PM  In your present state of health, do you have any difficulty performing the following activities:  Hearing? 0  Vision? 0  Difficulty concentrating or making decisions? 0  Walking or climbing stairs? 0  Dressing or bathing? 0  Doing errands, shopping? 0  Preparing Food and eating ? N  Using the Toilet? N  In the past six months, have you accidently leaked urine? N  Do you have  problems with loss of bowel control? N  Managing your Medications? N  Managing your Finances? N  Housekeeping or managing your Housekeeping? N    Patient Care Team: Agapito Games, MD as PCP - General (Family Medicine) Levasy, DR. Cardiology  Clovis Riley, mark, cardiology    Indicate any recent Medical Services you may have received from other than [facility] providers in the past year (date may be approximate).     Assessment:   This is a routine wellness examination for [place].  Hearing/Vision screen Hearing Screening - Comments:: Unable to test, grossly intact. Vision Screening - Comments:: Unable to test, wears glasses.    Goals Addressed             This Visit's Progress    Exercise 150 min/wk Moderate Activity        Depression Screen    02/12/2023    3:00 PM 02/06/2022    3:01 PM 01/30/2022    9:06 AM 04/20/2021    8:32 AM 02/01/2021    3:05 PM 01/26/2021    9:12 AM 01/26/2020    9:35 AM  PHQ 2/9 Scores  PHQ - 2 Score 0 0 0 0 0 0 0    Fall Risk    02/12/2023    3:01 PM 04/24/2022    1:34 PM 02/06/2022    3:02 PM 02/05/2022    3:32 PM 01/30/2022    9:06 AM  Fall Risk   Falls in the past year? 0 0 1 1 0  Number falls in past yr: 0 0 0 0 0  Injury with Fall? 0 0 1 1 0   Risk for fall due to : No Fall Risks No Fall Risks History of fall(s)  No Fall Risks  Follow up  Falls evaluation completed Falls evaluation completed;Education provided;Falls prevention discussed  Falls evaluation completed    MEDICARE RISK AT HOME: Medicare Risk at Home Any stairs in or around the home?: Yes If so, are there any without handrails?: Yes Home free of loose throw rugs in walkways, pet beds, electrical cords, etc?: Yes Adequate lighting in your home to reduce risk of falls?: Yes Life alert?: No Use of a cane, walker or w/c?: No Grab bars in the bathroom?: No Shower chair or bench in shower?: Yes Elevated toilet seat or a handicapped toilet?: Yes  TIMED UP AND GO:  Was the test performed?  No    Cognitive Function:        02/12/2023    3:02 PM 02/06/2022    3:03 PM 02/01/2021    3:15 PM 01/19/2020   10:25 AM 01/13/2019    9:44 AM  6CIT Screen  What Year? 0 points 0 points 0 points 0 points 0 points  What month? 0 points 0 points 0 points 0 points 0 points  What time? 0 points 0 points 0 points 0 points 0 points  Count back from 20 0 points 0 points 0 points 0 points 0 points  Months in reverse 0 points 0 points  0 points 0 points  Repeat phrase 0 points 0 points  0 points 0 points  Total Score 0 points 0 points  0 points 0 points    Immunizations Immunization History  Administered Date(s) Administered   Fluad Quad(high Dose 65+) 01/26/2021, 01/30/2022   Fluad Trivalent(High Dose 65+) 02/01/2023   Influenza Split 01/12/2020   Influenza,inj,Quad PF,6+ Mos 01/04/2017, 01/07/2018, 11/01/2018   Influenza-Unspecified 12/15/2015   Moderna Sars-Covid-2 Vaccination 05/21/2019, 06/18/2019, 01/12/2020  Pneumococcal Conjugate-13 01/13/2019   Pneumococcal Polysaccharide-23 01/26/2020   Tdap 03/02/2008, 01/07/2018   Zoster Recombinant(Shingrix) 01/04/2017, 06/28/2017   Zoster, Live 01/05/2016    TDAP status: Up to date  Flu Vaccine status: Up to  date  Pneumococcal vaccine status: Up to date  Covid-19 vaccine status: Declined, Education has been provided regarding the importance of this vaccine but patient still declined. Advised may receive this vaccine at local pharmacy or Health Dept.or vaccine clinic. Aware to provide a copy of the vaccination record if obtained from local pharmacy or Health Dept. Verbalized acceptance and understanding.  Qualifies for Shingles Vaccine? Yes   Zostavax completed Yes   Shingrix Completed?: Yes  Screening Tests Health Maintenance  Topic Date Due   COVID-19 Vaccine (4 - 2024-25 season) 10/29/2022   Colonoscopy  07/29/2023   Medicare Annual Wellness (AWV)  02/12/2024   MAMMOGRAM  04/19/2024   DTaP/Tdap/Td (3 - Td or Tdap) 01/08/2028   Pneumonia Vaccine 75+ Years old  Completed   INFLUENZA VACCINE  Completed   DEXA SCAN  Completed   Hepatitis C Screening  Completed   Zoster Vaccines- Shingrix  Completed   HPV VACCINES  Aged Out    Health Maintenance  Health Maintenance Due  Topic Date Due   COVID-19 Vaccine (4 - 2024-25 season) 10/29/2022    Colorectal cancer screening: Type of screening: Colonoscopy. Completed 07/28/2013. Repeat every 10 years  04/19/22  Bone Density status: Completed 03/01/22. Results reflect: Bone density results: OSTEOPENIA. Repeat every 2 years.  Lung Cancer Screening: (Low Dose CT Chest recommended if Age 83-80 years, 20 pack-year currently smoking OR have quit w/in 15years.) does not qualify.   Lung Cancer Screening Referral: n/a  Additional Screening:  Hepatitis C Screening: does qualify; Completed 01/05/2016  Vision Screening: Recommended annual ophthalmology exams for early detection of glaucoma and other disorders of the eye. Is the patient up to date with their annual eye exam?  Yes  Who is the provider or what is the name of the office in which the patient attends annual eye exams? [facility] [place]  If pt is not established with a provider,  would they like to be referred to a provider to establish care? No .   Dental Screening: Recommended annual dental exams for proper oral hygiene  Diabetic Foot Exam: n/a   Community Resource Referral / Chronic Care Management: CRR required this visit?  No   CCM required this visit?  No     Plan:     I have personally reviewed and noted the following in the patient's chart:   Medical and social history Use of alcohol, tobacco or illicit drugs  Current medications and supplements including opioid prescriptions. Patient is not currently taking opioid prescriptions. Functional ability and status Nutritional status Physical activity Advanced directives List of other physicians Hospitalizations, surgeries, and ER visits in previous 12 months: Rotator cuff surgery may 2024 outpatient surgery.  Vitals Screenings to include cognitive, depression, and falls Referrals and appointments  In addition, I have reviewed and discussed with patient certain preventive protocols, quality metrics, and best practice recommendations. A written personalized care plan for preventive services as well as general preventive health recommendations were provided to patient.     Novella Olive, FNP   02/12/2023   After Visit Summary: (MyChart) Due to this being a telephonic visit, the after visit summary with patients personalized plan was offered to patient via MyChart   Follow-up with PCP as scheduled. Declines need for  Covid vaccines.

## 2023-02-23 DIAGNOSIS — G620 Drug-induced polyneuropathy: Secondary | ICD-10-CM | POA: Diagnosis not present

## 2023-03-14 DIAGNOSIS — R040 Epistaxis: Secondary | ICD-10-CM | POA: Diagnosis not present

## 2023-03-14 DIAGNOSIS — H9202 Otalgia, left ear: Secondary | ICD-10-CM | POA: Diagnosis not present

## 2023-03-19 ENCOUNTER — Other Ambulatory Visit: Payer: Self-pay | Admitting: Family Medicine

## 2023-03-19 DIAGNOSIS — Z1231 Encounter for screening mammogram for malignant neoplasm of breast: Secondary | ICD-10-CM

## 2023-03-27 ENCOUNTER — Ambulatory Visit: Payer: Medicare Other | Admitting: Sports Medicine

## 2023-03-30 ENCOUNTER — Other Ambulatory Visit (INDEPENDENT_AMBULATORY_CARE_PROVIDER_SITE_OTHER): Payer: Medicare Other

## 2023-03-30 ENCOUNTER — Ambulatory Visit (INDEPENDENT_AMBULATORY_CARE_PROVIDER_SITE_OTHER): Payer: Medicare Other | Admitting: Sports Medicine

## 2023-03-30 DIAGNOSIS — M722 Plantar fascial fibromatosis: Secondary | ICD-10-CM | POA: Diagnosis not present

## 2023-03-30 DIAGNOSIS — M170 Bilateral primary osteoarthritis of knee: Secondary | ICD-10-CM | POA: Diagnosis not present

## 2023-03-30 NOTE — Progress Notes (Signed)
    Procedures performed today:    Procedure: Real-time Ultrasound Guided injection of the left knee Device: Samsung HS60  Verbal informed consent obtained.  Time-out conducted.  Noted no overlying erythema, induration, or other signs of local infection.  Skin prepped in a sterile fashion.  Local anesthesia: Topical Ethyl chloride.  With sterile technique and under real time ultrasound guidance: Mild effusion noted, 1 cc Kenalog 40, 2 cc lidocaine, 2 cc bupivacaine injected easily Completed without difficulty  Advised to call if fevers/chills, erythema, induration, drainage, or persistent bleeding.  Images permanently stored and available for review in PACS.  Impression: Technically successful ultrasound guided injection.  Procedure: Real-time Ultrasound Guided injection of the right knee Device: Samsung HS60  Verbal informed consent obtained.  Time-out conducted.  Noted no overlying erythema, induration, or other signs of local infection.  Skin prepped in a sterile fashion.  Local anesthesia: Topical Ethyl chloride.  With sterile technique and under real time ultrasound guidance: Mild effusion noted, plantar fascia 1 cc Kenalog 40, 2 cc lidocaine, 2 cc bupivacaine injected easily Completed without difficulty  Advised to call if fevers/chills, erythema, induration, drainage, or persistent bleeding.  Images permanently stored and available for review in PACS.  Impression: Technically successful ultrasound guided injection.  Independent interpretation of notes and tests performed by another provider:   None.  Brief History, Exam, Impression, and Recommendations:    Primary osteoarthritis of both knees This is a very pleasant 68 year old female, she has known bilateral knee osteoarthritis, she responded well to conservative treatment from the spring. Left knee continues to do well after an injection about 4 years ago. Now having recurrence of bilateral pain, we did bilateral  injections today, she will restart home physical therapy and return to see me in 6 weeks as needed. Visco is next if need be.  Plantar fasciitis, left Increasing left foot pain, she will avoid barefoot walking, she will get an air heel brace from [employer] and we will have her do intrinsic foot exercises, if not better in 6 weeks we will consider injection.    ____________________________________________ Ihor Austin. Benjamin Stain, M.D., ABFM., CAQSM., AME. Primary Care and Sports Medicine [facility]  Adjunct Professor of Family Medicine  [employer] [facility] [employer]  Restaurant manager, fast food

## 2023-03-30 NOTE — Assessment & Plan Note (Signed)
Increasing left foot pain, she will avoid barefoot walking, she will get an air heel brace from [employer] and we will have her do intrinsic foot exercises, if not better in 6 weeks we will consider injection.

## 2023-03-30 NOTE — Assessment & Plan Note (Signed)
This is a very pleasant 68 year old female, she has known bilateral knee osteoarthritis, she responded well to conservative treatment from the spring. Left knee continues to do well after an injection about 4 years ago. Now having recurrence of bilateral pain, we did bilateral injections today, she will restart home physical therapy and return to see me in 6 weeks as needed. Visco is next if need be.

## 2023-04-02 DIAGNOSIS — M170 Bilateral primary osteoarthritis of knee: Secondary | ICD-10-CM | POA: Diagnosis not present

## 2023-04-02 DIAGNOSIS — M722 Plantar fascial fibromatosis: Secondary | ICD-10-CM | POA: Diagnosis not present

## 2023-04-02 MED ORDER — TRIAMCINOLONE ACETONIDE 40 MG/ML IJ SUSP
80.0000 mg | Freq: Once | INTRAMUSCULAR | Status: AC
  Administered 2023-04-02: 80 mg via INTRAMUSCULAR

## 2023-04-02 NOTE — Code Documentation (Signed)
 done

## 2023-04-02 NOTE — Addendum Note (Signed)
Addended by: Carren Rang A on: 04/02/2023 04:27 PM   Modules accepted: Orders

## 2023-04-09 DIAGNOSIS — M436 Torticollis: Secondary | ICD-10-CM | POA: Diagnosis not present

## 2023-04-09 DIAGNOSIS — R293 Abnormal posture: Secondary | ICD-10-CM | POA: Diagnosis not present

## 2023-04-09 DIAGNOSIS — H9202 Otalgia, left ear: Secondary | ICD-10-CM | POA: Diagnosis not present

## 2023-05-02 ENCOUNTER — Ambulatory Visit: Payer: Medicare Other

## 2023-05-02 DIAGNOSIS — Z1231 Encounter for screening mammogram for malignant neoplasm of breast: Secondary | ICD-10-CM

## 2023-05-07 DIAGNOSIS — J342 Deviated nasal septum: Secondary | ICD-10-CM | POA: Diagnosis not present

## 2023-05-07 DIAGNOSIS — H9202 Otalgia, left ear: Secondary | ICD-10-CM | POA: Diagnosis not present

## 2023-05-07 DIAGNOSIS — M436 Torticollis: Secondary | ICD-10-CM | POA: Diagnosis not present

## 2023-05-07 DIAGNOSIS — R293 Abnormal posture: Secondary | ICD-10-CM | POA: Diagnosis not present

## 2023-05-08 ENCOUNTER — Encounter: Payer: Self-pay | Admitting: Physician Assistant

## 2023-05-08 NOTE — Progress Notes (Signed)
 Normal mammogram. Follow up in 1 year.

## 2023-05-11 ENCOUNTER — Ambulatory Visit (INDEPENDENT_AMBULATORY_CARE_PROVIDER_SITE_OTHER): Payer: Medicare Other | Admitting: Sports Medicine

## 2023-05-11 DIAGNOSIS — M722 Plantar fascial fibromatosis: Secondary | ICD-10-CM | POA: Diagnosis not present

## 2023-05-11 DIAGNOSIS — M170 Bilateral primary osteoarthritis of knee: Secondary | ICD-10-CM | POA: Diagnosis not present

## 2023-05-11 NOTE — Assessment & Plan Note (Signed)
 90% improvement after home physical therapy, we did discuss more accurate placement of her air heel brace. She will continue to avoid barefoot walking, continue home physical therapy and return to see me as needed.

## 2023-05-11 NOTE — Progress Notes (Signed)
    Procedures performed today:    None.  Independent interpretation of notes and tests performed by another provider:   None.  Brief History, Exam, Impression, and Recommendations:    Primary osteoarthritis of both knees Doing a lot better after bilateral steroid injections at the last visit, only minimal discomfort medial joint line right knee, we can proceed with Visco in the future however she does not feel as though she needs it at this juncture.  Plantar fasciitis, left 90% improvement after home physical therapy, we did discuss more accurate placement of her air heel brace. She will continue to avoid barefoot walking, continue home physical therapy and return to see me as needed.    ____________________________________________ Ihor Austin. Benjamin Stain, M.D., ABFM., CAQSM., AME. Primary Care and Sports Medicine [facility]  Adjunct Professor of Family Medicine  [employer] [facility] [employer]  Restaurant manager, fast food

## 2023-05-11 NOTE — Assessment & Plan Note (Signed)
 Doing a lot better after bilateral steroid injections at the last visit, only minimal discomfort medial joint line right knee, we can proceed with Visco in the future however she does not feel as though she needs it at this juncture.

## 2023-05-17 DIAGNOSIS — H5203 Hypermetropia, bilateral: Secondary | ICD-10-CM | POA: Diagnosis not present

## 2023-06-11 DIAGNOSIS — K080 Exfoliation of teeth due to systemic causes: Secondary | ICD-10-CM | POA: Diagnosis not present

## 2023-06-13 DIAGNOSIS — M436 Torticollis: Secondary | ICD-10-CM | POA: Diagnosis not present

## 2023-06-13 DIAGNOSIS — H9202 Otalgia, left ear: Secondary | ICD-10-CM | POA: Diagnosis not present

## 2023-06-13 DIAGNOSIS — R293 Abnormal posture: Secondary | ICD-10-CM | POA: Diagnosis not present

## 2023-07-13 ENCOUNTER — Encounter: Payer: Self-pay | Admitting: Sports Medicine

## 2023-07-13 ENCOUNTER — Ambulatory Visit (INDEPENDENT_AMBULATORY_CARE_PROVIDER_SITE_OTHER): Admitting: Sports Medicine

## 2023-07-13 ENCOUNTER — Other Ambulatory Visit (INDEPENDENT_AMBULATORY_CARE_PROVIDER_SITE_OTHER)

## 2023-07-13 DIAGNOSIS — M722 Plantar fascial fibromatosis: Secondary | ICD-10-CM

## 2023-07-13 DIAGNOSIS — M79645 Pain in left finger(s): Secondary | ICD-10-CM | POA: Diagnosis not present

## 2023-07-13 MED ORDER — TRIAMCINOLONE ACETONIDE 40 MG/ML IJ SUSP
40.0000 mg | Freq: Once | INTRAMUSCULAR | Status: AC
Start: 2023-07-13 — End: 2023-07-13
  Administered 2023-07-13: 40 mg via INTRAMUSCULAR

## 2023-07-13 NOTE — Progress Notes (Signed)
    Procedures performed today:    Procedure: Real-time Ultrasound Guided injection of the left plantar fascia origin Device: Samsung HS60  Verbal informed consent obtained.  Time-out conducted.  Noted no overlying erythema, induration, or other signs of local infection.  Skin prepped in a sterile fashion.  Local anesthesia: Topical Ethyl chloride.  With sterile technique and under real time ultrasound guidance: Noted thickened plantar fascia, 1 cc Kenalog  40, 1 cc lidocaine, 1 cc bupivacaine injected easily Completed without difficulty  Advised to call if fevers/chills, erythema, induration, drainage, or persistent bleeding.  Images permanently stored and available for review in PACS.  Impression: Technically successful ultrasound guided injection.  Independent interpretation of notes and tests performed by another provider:   None.  Brief History, Exam, Impression, and Recommendations:    Plantar fasciitis, left Initial improvement after physical therapy, air heel bracing, avoidance of barefoot walking and now with recurrence of pain, to the medial left plantar fascial origin injection, return in 6 weeks.  Pain in finger of left hand Also having some pain over the left index finger at the DIP, this occurs predominately when her finger rests against the comb, she is a hairstylist. I am able to palpate the pain generator which does not feel to be the digital nerve on the radial aspect of the second PIP. She will get some compression gloves for padding and avoid pressure, this will likely improve over the next several months.    ____________________________________________ Joselyn Nicely. Sandy Crumb, M.D., ABFM., CAQSM., AME. Primary Care and Sports Medicine [facility]  Adjunct Professor of [place] [facility] Medicine  [employer]  Restaurant manager, fast food

## 2023-07-13 NOTE — Assessment & Plan Note (Signed)
 Initial improvement after physical therapy, air heel bracing, avoidance of barefoot walking and now with recurrence of pain, to the medial left plantar fascial origin injection, return in 6 weeks.

## 2023-07-13 NOTE — Addendum Note (Signed)
 Addended by: OLIVA-AVELLANEDA, Jarnell Cordaro L on: 07/13/2023 02:34 PM   Modules accepted: Orders

## 2023-07-13 NOTE — Assessment & Plan Note (Signed)
 Also having some pain over the left index finger at the DIP, this occurs predominately when her finger rests against the comb, she is a hairstylist. I am able to palpate the pain generator which does not feel to be the digital nerve on the radial aspect of the second PIP. She will get some compression gloves for padding and avoid pressure, this will likely improve over the next several months.

## 2023-07-19 DIAGNOSIS — D6869 Other thrombophilia: Secondary | ICD-10-CM | POA: Diagnosis not present

## 2023-07-19 DIAGNOSIS — M8580 Other specified disorders of bone density and structure, unspecified site: Secondary | ICD-10-CM | POA: Diagnosis not present

## 2023-07-25 DIAGNOSIS — Z8241 Family history of sudden cardiac death: Secondary | ICD-10-CM | POA: Diagnosis not present

## 2023-07-25 DIAGNOSIS — R42 Dizziness and giddiness: Secondary | ICD-10-CM | POA: Diagnosis not present

## 2023-07-25 DIAGNOSIS — I2510 Atherosclerotic heart disease of native coronary artery without angina pectoris: Secondary | ICD-10-CM | POA: Diagnosis not present

## 2023-07-25 DIAGNOSIS — R002 Palpitations: Secondary | ICD-10-CM | POA: Diagnosis not present

## 2023-07-30 DIAGNOSIS — R002 Palpitations: Secondary | ICD-10-CM | POA: Diagnosis not present

## 2023-07-30 DIAGNOSIS — I4719 Other supraventricular tachycardia: Secondary | ICD-10-CM | POA: Diagnosis not present

## 2023-08-13 DIAGNOSIS — R002 Palpitations: Secondary | ICD-10-CM | POA: Diagnosis not present

## 2023-08-13 DIAGNOSIS — I4719 Other supraventricular tachycardia: Secondary | ICD-10-CM | POA: Diagnosis not present

## 2023-08-21 ENCOUNTER — Ambulatory Visit (INDEPENDENT_AMBULATORY_CARE_PROVIDER_SITE_OTHER): Admitting: Sports Medicine

## 2023-08-21 DIAGNOSIS — M722 Plantar fascial fibromatosis: Secondary | ICD-10-CM

## 2023-08-21 DIAGNOSIS — M79645 Pain in left finger(s): Secondary | ICD-10-CM

## 2023-08-21 NOTE — Assessment & Plan Note (Signed)
 Much better after injection at the last visit, she continues to avoid barefoot walking, she wears supportive shoes, does her conditioning, return as needed for this.

## 2023-08-21 NOTE — Assessment & Plan Note (Signed)
 Left index finger DIP pain, this has resolved with wearing compression gloves. Return as needed.

## 2023-08-21 NOTE — Progress Notes (Signed)
    Procedures performed today:    None.  Independent interpretation of notes and tests performed by another provider:   None.  Brief History, Exam, Impression, and Recommendations:    Plantar fasciitis, left Much better after injection at the last visit, she continues to avoid barefoot walking, she wears supportive shoes, does her conditioning, return as needed for this.  Pain in finger of left hand Left index finger DIP pain, this has resolved with wearing compression gloves. Return as needed.    ____________________________________________ Debby PARAS. Curtis, M.D., ABFM., CAQSM., AME. Primary Care and Sports Medicine [facility]  Adjunct Professor of [facility] Medicine  [employer]  Restaurant manager, fast food

## 2023-09-04 DIAGNOSIS — I2510 Atherosclerotic heart disease of native coronary artery without angina pectoris: Secondary | ICD-10-CM | POA: Diagnosis not present

## 2023-09-04 DIAGNOSIS — E7849 Other hyperlipidemia: Secondary | ICD-10-CM | POA: Diagnosis not present

## 2023-09-04 DIAGNOSIS — R002 Palpitations: Secondary | ICD-10-CM | POA: Diagnosis not present

## 2023-09-04 DIAGNOSIS — I471 Supraventricular tachycardia, unspecified: Secondary | ICD-10-CM | POA: Diagnosis not present

## 2023-09-17 DIAGNOSIS — E7849 Other hyperlipidemia: Secondary | ICD-10-CM | POA: Diagnosis not present

## 2023-09-17 DIAGNOSIS — I2510 Atherosclerotic heart disease of native coronary artery without angina pectoris: Secondary | ICD-10-CM | POA: Diagnosis not present

## 2023-09-17 DIAGNOSIS — R002 Palpitations: Secondary | ICD-10-CM | POA: Diagnosis not present

## 2023-09-17 DIAGNOSIS — I471 Supraventricular tachycardia, unspecified: Secondary | ICD-10-CM | POA: Diagnosis not present

## 2023-09-19 ENCOUNTER — Ambulatory Visit: Payer: Self-pay | Admitting: Family Medicine

## 2023-10-30 ENCOUNTER — Encounter: Payer: Self-pay | Admitting: Sports Medicine

## 2023-11-05 DIAGNOSIS — I480 Paroxysmal atrial fibrillation: Secondary | ICD-10-CM | POA: Diagnosis not present

## 2023-11-05 DIAGNOSIS — R002 Palpitations: Secondary | ICD-10-CM | POA: Diagnosis not present

## 2023-11-05 DIAGNOSIS — Z8241 Family history of sudden cardiac death: Secondary | ICD-10-CM | POA: Diagnosis not present

## 2023-11-05 DIAGNOSIS — I471 Supraventricular tachycardia, unspecified: Secondary | ICD-10-CM | POA: Diagnosis not present

## 2023-11-05 DIAGNOSIS — I2510 Atherosclerotic heart disease of native coronary artery without angina pectoris: Secondary | ICD-10-CM | POA: Diagnosis not present

## 2023-11-05 DIAGNOSIS — I4719 Other supraventricular tachycardia: Secondary | ICD-10-CM | POA: Diagnosis not present

## 2023-11-05 DIAGNOSIS — I951 Orthostatic hypotension: Secondary | ICD-10-CM | POA: Diagnosis not present

## 2023-11-07 DIAGNOSIS — I4719 Other supraventricular tachycardia: Secondary | ICD-10-CM | POA: Diagnosis not present

## 2023-11-07 DIAGNOSIS — I480 Paroxysmal atrial fibrillation: Secondary | ICD-10-CM | POA: Diagnosis not present

## 2023-11-08 DIAGNOSIS — M722 Plantar fascial fibromatosis: Secondary | ICD-10-CM | POA: Diagnosis not present

## 2023-11-08 DIAGNOSIS — M79672 Pain in left foot: Secondary | ICD-10-CM | POA: Diagnosis not present

## 2023-12-19 ENCOUNTER — Other Ambulatory Visit: Payer: Self-pay | Admitting: Family Medicine

## 2023-12-19 DIAGNOSIS — E785 Hyperlipidemia, unspecified: Secondary | ICD-10-CM

## 2023-12-20 DIAGNOSIS — M722 Plantar fascial fibromatosis: Secondary | ICD-10-CM | POA: Diagnosis not present

## 2024-01-22 DIAGNOSIS — L820 Inflamed seborrheic keratosis: Secondary | ICD-10-CM | POA: Diagnosis not present

## 2024-01-22 DIAGNOSIS — D2372 Other benign neoplasm of skin of left lower limb, including hip: Secondary | ICD-10-CM | POA: Diagnosis not present

## 2024-01-22 DIAGNOSIS — L814 Other melanin hyperpigmentation: Secondary | ICD-10-CM | POA: Diagnosis not present

## 2024-01-22 DIAGNOSIS — D485 Neoplasm of uncertain behavior of skin: Secondary | ICD-10-CM | POA: Diagnosis not present

## 2024-01-22 DIAGNOSIS — L821 Other seborrheic keratosis: Secondary | ICD-10-CM | POA: Diagnosis not present

## 2024-01-22 DIAGNOSIS — L538 Other specified erythematous conditions: Secondary | ICD-10-CM | POA: Diagnosis not present

## 2024-01-22 DIAGNOSIS — Z789 Other specified health status: Secondary | ICD-10-CM | POA: Diagnosis not present

## 2024-01-22 DIAGNOSIS — D1801 Hemangioma of skin and subcutaneous tissue: Secondary | ICD-10-CM | POA: Diagnosis not present

## 2024-02-04 ENCOUNTER — Ambulatory Visit: Payer: Medicare Other | Admitting: Family Medicine

## 2024-02-04 ENCOUNTER — Encounter: Payer: Self-pay | Admitting: Family Medicine

## 2024-02-04 VITALS — BP 124/71 | HR 77 | Ht 65.0 in | Wt 189.4 lb

## 2024-02-04 DIAGNOSIS — R7989 Other specified abnormal findings of blood chemistry: Secondary | ICD-10-CM | POA: Diagnosis not present

## 2024-02-04 DIAGNOSIS — E785 Hyperlipidemia, unspecified: Secondary | ICD-10-CM

## 2024-02-04 DIAGNOSIS — Z23 Encounter for immunization: Secondary | ICD-10-CM

## 2024-02-04 DIAGNOSIS — M8580 Other specified disorders of bone density and structure, unspecified site: Secondary | ICD-10-CM

## 2024-02-04 NOTE — Progress Notes (Signed)
 Complete physical exam  Patient: Julia Sweaney HerbstFemale    DOB: November 05, 1955 68 y.o.   MRN: [id number]  Chief Complaint  Patient presents with   Annual Exam    Subjective:    Julia Perez is a 68 y.o. female who presents today for a complete physical exam. She reports consuming a general diet. Occ exercise but planning on getting gback into a routine  She generally feels well.  She does not have additional problems to discuss today.   Her mother was on hospice and passed yesterday. She feels at peace about it .   Discussed the use of AI scribe software for clinical note transcription with the patient, who gave verbal consent to proceed.  History of Present Illness Julia Perez is a 68 year old female who presents for routine follow-up and preventive care.  Colorectal cancer screening - Colonoscopy scheduled for February 7th, previously postponed from July due to stress related to starting Eliquis and changing from diltiazem - Completed fecal occult blood test with normal results - Acknowledges need for colonoscopy for comprehensive screening  Breast cancer screening - Mammogram completed last year, uncertain of exact date (believes January or March of previous year) - Plans to schedule next mammogram soon  Chest discomfort - Occasional chest twinges, particularly when knitting - Twinges are different from previous chest pain - Treadmill test completed after reporting chest twinges - Suspects twinges may be related to chest wall or mechanical issues, especially given left-handedness and history of left shoulder surgery - No chest pain or breathing problems during physical activity  Dermatologic evaluation - Recent dermatology visit with removal of a lesion from the leg and cryotherapy to two facial spots - No concerning findings communicated by dermatologist  Immunization status - Tetanus vaccination valid until 2029 - Recent influenza vaccination - Completed  shingles vaccination series - Received Prevnar 13 and Pneumovax - Uncertain if Prevnar 20 has been administered  Musculoskeletal symptoms - Knees have been doing well lately   Most recent fall risk assessment:    02/04/2024    9:16 AM  Fall Risk   Falls in the past year? 0  Number falls in past yr: 0  Injury with Fall? 0  Risk for fall due to : No Fall Risks  Follow up Falls evaluation completed     Most recent depression screenings:    02/04/2024    9:15 AM 02/12/2023    3:00 PM  PHQ 2/9 Scores  PHQ - 2 Score 0 0        Patient Care Team: Alvan Dorothyann BIRCH, MD as PCP - General (Family Medicine)   ROS    Objective:    BP 124/71   Pulse 77   Ht 5' 5 (1.651 m)   Wt 189 lb 6.4 oz (85.9 kg)   SpO2 99%   BMI 31.52 kg/m     Physical Exam Constitutional:      Appearance: Normal appearance.  HENT:     Head: Normocephalic and atraumatic.     Right Ear: Tympanic membrane, ear canal and external ear normal.     Left Ear: Tympanic membrane, ear canal and external ear normal.     Nose: Nose normal.     Mouth/Throat:     Pharynx: Oropharynx is clear.  Eyes:     Extraocular Movements: Extraocular movements intact.     Conjunctiva/sclera: Conjunctivae normal.     Pupils: Pupils are equal, round, and reactive to light.  Neck:  Thyroid : No thyromegaly.  Cardiovascular:     Rate and Rhythm: Normal rate and regular rhythm.  Pulmonary:     Effort: Pulmonary effort is normal.     Breath sounds: Normal breath sounds.  Abdominal:     General: Bowel sounds are normal.     Palpations: Abdomen is soft.     Tenderness: There is no abdominal tenderness.  Musculoskeletal:        General: No swelling.     Cervical back: Neck supple.  Skin:    General: Skin is warm and dry.  Neurological:     Mental Status: She is oriented to person, place, and time.  Psychiatric:        Mood and Affect: Mood normal.        Behavior: Behavior normal.       No results found  for any visits on 02/04/24.       Assessment & Plan:    Routine Health Maintenance and Physical Exam Immunization History  Administered Date(s) Administered   Fluad Quad(high Dose 65+) 01/26/2021, 01/30/2022   Fluad Trivalent(High Dose 65+) 02/01/2023   INFLUENZA, HIGH DOSE SEASONAL PF 02/04/2024   Influenza Split 01/12/2020   Influenza,inj,Quad PF,6+ Mos 01/04/2017, 01/07/2018, 11/01/2018   Influenza-Unspecified 12/15/2015   Moderna Sars-Covid-2 Vaccination 05/21/2019, 06/18/2019, 01/12/2020   PNEUMOCOCCAL CONJUGATE-20 02/04/2024   Pneumococcal Conjugate-13 01/13/2019   Pneumococcal Polysaccharide-23 01/26/2020   Tdap 03/02/2008, 01/07/2018   Zoster Recombinant(Shingrix ) 01/04/2017, 06/28/2017   Zoster, Live 01/05/2016    Health Maintenance  Topic Date Due   Colonoscopy  07/29/2023   COVID-19 Vaccine (4 - 2025-26 season) 10/29/2023   Medicare Annual Wellness (AWV)  02/12/2024   Mammogram  05/01/2025   DTaP/Tdap/Td (3 - Td or Tdap) 01/08/2028   Pneumococcal Vaccine: 50+ Years  Completed   Influenza Vaccine  Completed   Bone Density Scan  Completed   Hepatitis C Screening  Completed   Zoster Vaccines- Shingrix   Completed   Meningococcal B Vaccine  Aged Out    Discussed health benefits of physical activity, and encouraged her to engage in regular exercise appropriate for her age and condition.  Problem List Items Addressed This Visit       Musculoskeletal and Integument   Osteopenia   Relevant Orders   CMP14+EGFR   Lipid panel   CBC   VITAMIN D  25 Hydroxy (Vit-D Deficiency, Fractures)     Other   Hyperlipidemia   Relevant Medications   ELIQUIS 5 MG TABS tablet   verapamil (CALAN-SR) 120 MG CR tablet   Other Relevant Orders   CMP14+EGFR   Lipid panel   CBC   VITAMIN D  25 Hydroxy (Vit-D Deficiency, Fractures)   Other Visit Diagnoses       Encounter for immunization    -  Primary   Relevant Orders   Flu vaccine HIGH DOSE PF(Fluzone Trivalent) (Completed)      Low vitamin D  level       Relevant Orders   CMP14+EGFR   Lipid panel   CBC   VITAMIN D  25 Hydroxy (Vit-D Deficiency, Fractures)     Encounter for immunization       Relevant Orders   Pneumococcal conjugate vaccine 20-valent (Completed)      Assessment and Plan Assessment & Plan Chest wall pain Intermittent mechanical chest wall pain, not ischemic or cardiac-related.  General Health Maintenance Routine health maintenance discussed. Vaccinations and screenings reviewed. - Ordered fasting labs. - Administer Prevnar 20 if not previously received. - Ensure colonoscopy  on February 7th. - Schedule mammogram for March.    Return in about 1 year (around 02/03/2025) for Wellness Exam.    Dorothyann Byars, MD [facility] at [facility]

## 2024-02-05 ENCOUNTER — Ambulatory Visit: Payer: Self-pay | Admitting: Family Medicine

## 2024-02-05 LAB — CBC
Hematocrit: 40.6 % (ref 34.0–46.6)
Hemoglobin: 13.3 g/dL (ref 11.1–15.9)
MCH: 30.2 pg (ref 26.6–33.0)
MCHC: 32.8 g/dL (ref 31.5–35.7)
MCV: 92 fL (ref 79–97)
Platelets: 248 x10E3/uL (ref 150–450)
RBC: 4.41 x10E6/uL (ref 3.77–5.28)
RDW: 12.4 % (ref 11.7–15.4)
WBC: 4.5 x10E3/uL (ref 3.4–10.8)

## 2024-02-05 LAB — VITAMIN D 25 HYDROXY (VIT D DEFICIENCY, FRACTURES): Vit D, 25-Hydroxy: 30.8 ng/mL (ref 30.0–100.0)

## 2024-02-05 LAB — CMP14+EGFR
ALT: 20 IU/L (ref 0–32)
AST: 20 IU/L (ref 0–40)
Albumin: 4.3 g/dL (ref 3.9–4.9)
Alkaline Phosphatase: 104 IU/L (ref 49–135)
BUN/Creatinine Ratio: 17 (ref 12–28)
BUN: 15 mg/dL (ref 8–27)
Bilirubin Total: 0.8 mg/dL (ref 0.0–1.2)
CO2: 24 mmol/L (ref 20–29)
Calcium: 9.2 mg/dL (ref 8.7–10.3)
Chloride: 102 mmol/L (ref 96–106)
Creatinine, Ser: 0.86 mg/dL (ref 0.57–1.00)
Globulin, Total: 2.2 g/dL (ref 1.5–4.5)
Glucose: 86 mg/dL (ref 70–99)
Potassium: 3.8 mmol/L (ref 3.5–5.2)
Sodium: 139 mmol/L (ref 134–144)
Total Protein: 6.5 g/dL (ref 6.0–8.5)
eGFR: 73 mL/min/1.73 (ref >59)

## 2024-02-05 LAB — LIPID PANEL
Chol/HDL Ratio: 2.4 ratio (ref 0.0–4.4)
Cholesterol, Total: 165 mg/dL (ref 100–199)
HDL: 70 mg/dL (ref >39)
LDL Chol Calc (NIH): 79 mg/dL (ref 0–99)
Triglycerides: 86 mg/dL (ref 0–149)
VLDL Cholesterol Cal: 16 mg/dL (ref 5–40)

## 2024-02-05 NOTE — Progress Notes (Signed)
 Hi Julia Perez, metabolic panel and cholesterol look great.  Blood counts normal.  Vitamin D  has come up compared to last year but still on the low end so please keep taking your vitamin D  daily.

## 2024-03-18 ENCOUNTER — Other Ambulatory Visit (HOSPITAL_BASED_OUTPATIENT_CLINIC_OR_DEPARTMENT_OTHER): Payer: Self-pay | Admitting: Family Medicine

## 2024-03-18 DIAGNOSIS — Z1231 Encounter for screening mammogram for malignant neoplasm of breast: Secondary | ICD-10-CM

## 2024-05-01 ENCOUNTER — Ambulatory Visit

## 2024-05-05 ENCOUNTER — Ambulatory Visit (HOSPITAL_BASED_OUTPATIENT_CLINIC_OR_DEPARTMENT_OTHER)

## 2025-02-04 ENCOUNTER — Encounter: Admitting: Family Medicine
